# Patient Record
Sex: Female | Born: 1965 | Race: White | Hispanic: No | Marital: Married | Smoking: Never smoker
Health system: Southern US, Community
[De-identification: ages and names within clinical notes are randomized; demographics above are authoritative.]

## PROBLEM LIST (undated history)

## (undated) DIAGNOSIS — G43909 Migraine, unspecified, not intractable, without status migrainosus: Secondary | ICD-10-CM

## (undated) DIAGNOSIS — K209 Esophagitis, unspecified: Secondary | ICD-10-CM

## (undated) DIAGNOSIS — R55 Syncope and collapse: Secondary | ICD-10-CM

## (undated) HISTORY — DX: Migraine, unspecified, not intractable, without status migrainosus: G43.909

## (undated) HISTORY — DX: Esophagitis, unspecified: K20.9

## (undated) HISTORY — DX: Syncope and collapse: R55

---

## 1998-11-08 ENCOUNTER — Other Ambulatory Visit: Admission: RE | Admit: 1998-11-08 | Discharge: 1998-11-08 | Payer: Self-pay | Admitting: Obstetrics and Gynecology

## 1999-06-01 ENCOUNTER — Inpatient Hospital Stay (HOSPITAL_COMMUNITY): Admission: AD | Admit: 1999-06-01 | Discharge: 1999-06-03 | Payer: Self-pay | Admitting: *Deleted

## 2012-07-24 ENCOUNTER — Other Ambulatory Visit: Payer: Self-pay

## 2012-07-24 DIAGNOSIS — Z1231 Encounter for screening mammogram for malignant neoplasm of breast: Secondary | ICD-10-CM

## 2012-08-13 ENCOUNTER — Ambulatory Visit: Payer: Self-pay | Admitting: Certified Nurse Midwife

## 2012-08-21 ENCOUNTER — Ambulatory Visit
Admission: RE | Admit: 2012-08-21 | Discharge: 2012-08-21 | Disposition: A | Discharge status: Home / Self Care | Payer: 59 | Source: Ambulatory Visit

## 2012-08-21 ENCOUNTER — Ambulatory Visit: Payer: Self-pay

## 2012-08-21 DIAGNOSIS — Z1231 Encounter for screening mammogram for malignant neoplasm of breast: Secondary | ICD-10-CM

## 2012-08-24 ENCOUNTER — Encounter: Payer: Self-pay | Admitting: Certified Nurse Midwife

## 2012-08-26 ENCOUNTER — Encounter: Payer: Self-pay | Admitting: Certified Nurse Midwife

## 2012-08-26 ENCOUNTER — Ambulatory Visit (INDEPENDENT_AMBULATORY_CARE_PROVIDER_SITE_OTHER): Payer: 59 | Admitting: Certified Nurse Midwife

## 2012-08-26 VITALS — BP 136/82 | HR 64 | Resp 20 | Ht 65.0 in | Wt 214.8 lb

## 2012-08-26 DIAGNOSIS — Z0000 Encounter for general adult medical examination without abnormal findings: Secondary | ICD-10-CM

## 2012-08-26 DIAGNOSIS — Z01419 Encounter for gynecological examination (general) (routine) without abnormal findings: Secondary | ICD-10-CM

## 2012-08-26 LAB — COMPREHENSIVE METABOLIC PANEL
ALT: 19 U/L (ref 0–35)
AST: 19 U/L (ref 0–37)
Albumin: 4 g/dL (ref 3.5–5.2)
Alkaline Phosphatase: 86 U/L (ref 39–117)
BUN: 9 mg/dL (ref 6–23)
CO2: 27 mEq/L (ref 19–32)
Calcium: 9.4 mg/dL (ref 8.4–10.5)
Chloride: 103 mEq/L (ref 96–112)
Creat: 0.82 mg/dL (ref 0.50–1.10)
Glucose, Bld: 77 mg/dL (ref 70–99)
Potassium: 4.1 mEq/L (ref 3.5–5.3)
Sodium: 138 mEq/L (ref 135–145)
Total Bilirubin: 0.4 mg/dL (ref 0.3–1.2)
Total Protein: 6.8 g/dL (ref 6.0–8.3)

## 2012-08-26 LAB — HEMOGLOBIN, FINGERSTICK: Hemoglobin, fingerstick: 13.5 g/dL (ref 12.0–16.0)

## 2012-08-26 LAB — POCT URINALYSIS DIPSTICK
Bilirubin, UA: NEGATIVE
Blood, UA: NEGATIVE
Glucose, UA: NEGATIVE
Ketones, UA: NEGATIVE
Leukocytes, UA: NEGATIVE
Nitrite, UA: NEGATIVE
Protein, UA: NEGATIVE
Urobilinogen, UA: NEGATIVE
pH, UA: 5

## 2012-08-26 NOTE — Patient Instructions (Signed)

## 2012-08-26 NOTE — Progress Notes (Signed)
47 y.o. G56P2002 Married Caucasian F here for annual exam. Periods for the past two months normal for her, not heavy.  Prior to that scant to none in previous months. Denies hot flashes or night sweats.  No health issues today. Sees PCP prn. Patient's last menstrual period was 08/18/2012.          Sexually active: yes  The current method of family planning is vasectomy.    Exercising: yes  walking Smoker:  no  Health Maintenance: Pap:  08/13/11 WNL/negative HR HPV History of abnormal Pap:  no MMG:  08/21/12 3D-normal Colonoscopy:  none BMD:   none TDaP:  08/13/11 Screening Labs: , Hgb today: 13.5, Urine today: negative   reports that she has never smoked. She has never used smokeless tobacco. She reports that  drinks alcohol. She reports that she does not use illicit drugs.  Past Medical History  Diagnosis Date  . Migraines     no aura  . Syncopal episodes     situational    History reviewed. No pertinent past surgical history.  Current Outpatient Prescriptions  Medication Sig Dispense Refill  . ibuprofen (ADVIL,MOTRIN) 200 MG tablet Take 200 mg by mouth every 6 (six) hours as needed for pain.       No current facility-administered medications for this visit.    Family History  Problem Relation Age of Onset  . Hypertension Mother   . Depression Mother   . Hypertension Father   . Depression Father   . Depression Brother   . Hypertension Maternal Grandmother     ROS:  Pertinent items are noted in HPI.  Otherwise, a comprehensive ROS was negative.  Exam:   BP 136/82  Pulse 64  Resp 20  Ht 5\' 5"  (1.651 m)  Wt 214 lb 12.8 oz (97.433 kg)  BMI 35.74 kg/m2  LMP 08/18/2012  Weight change: @WEIGHTCHANGE @ Height:   Height: 5\' 5"  (165.1 cm)  Ht Readings from Last 3 Encounters:  08/26/12 5\' 5"  (1.651 m)    General appearance: alert, cooperative and appears stated age Head: Normocephalic, without obvious abnormality, atraumatic Neck: no adenopathy, supple, symmetrical,  trachea midline and thyroid normal to inspection and palpation Lungs: clear to auscultation bilaterally Breasts: normal appearance, no masses or tenderness, No nipple retraction or dimpling, No nipple discharge or bleeding, No axillary or supraclavicular adenopathy Heart: regular rate and rhythm Abdomen: soft, non-tender; bowel sounds normal; no masses,  no organomegaly Extremities: extremities normal, atraumatic, no cyanosis or edema Skin: Skin color, texture, turgor normal. No rashes or lesions Lymph nodes: Cervical, supraclavicular, and axillary nodes normal. No abnormal inguinal nodes palpated Neurologic: Grossly normal   Pelvic: External genitalia:  no lesions              Urethra:  normal appearing urethra with no masses, tenderness or lesions              Bartholins and Skenes: normal                 Vagina: normal appearing vagina with normal color and discharge, no lesions              Cervix: normal, non tender              Pap taken: no Bimanual Exam:  Uterus:  normal size, contour, position, consistency, mobility, non-tender and mid position              Adnexa: normal adnexa and no mass, fullness, tenderness  Rectovaginal: Confirms               Anus:  normal sphincter tone, no lesions  A:  Well Woman with normal exam  Perimenopausal with cycle changes    P:   Reviewed Health and wellness pertinent to exam  Discussed importance of notifying if no cycle for more than 3 months, has menses calendar. Pap smear per guidelines  Mammogram yearly pap smear not taken today Lab:CMP,TSH  counseled on breast self exam, adequate intake of calcium and vitamin D, diet and exercise  return annually or prn  An After Visit Summary was printed and given to the patient.   Reviewed, TL

## 2012-08-27 LAB — TSH: TSH: 2.534 u[IU]/mL (ref 0.350–4.500)

## 2012-08-28 ENCOUNTER — Telehealth: Payer: Self-pay | Admitting: *Deleted

## 2012-08-28 NOTE — Telephone Encounter (Signed)
Message copied by Osie Bond on Fri Aug 28, 2012  3:10 PM ------      Message from: Verner Chol      Created: Thu Aug 27, 2012  5:48 PM       Notify liver, kidney, glucose profile normal, Thyroid normal ------

## 2012-08-28 NOTE — Telephone Encounter (Signed)
Message copied by Osie Bond on Fri Aug 28, 2012  8:39 AM ------      Message from: Verner Chol      Created: Thu Aug 27, 2012  5:48 PM       Notify liver, kidney, glucose profile normal, Thyroid normal ------

## 2012-08-28 NOTE — Telephone Encounter (Signed)
Patient returning Donna Shepard's call.

## 2012-08-28 NOTE — Telephone Encounter (Signed)
Pt is aware of all normal lab results.

## 2012-08-28 NOTE — Telephone Encounter (Signed)
Pt is aware of all normal lab results.  

## 2012-08-28 NOTE — Telephone Encounter (Signed)
LVM to return my call in regards to lab results.  

## 2013-04-19 ENCOUNTER — Encounter: Payer: Self-pay | Admitting: Certified Nurse Midwife

## 2013-04-19 ENCOUNTER — Ambulatory Visit (INDEPENDENT_AMBULATORY_CARE_PROVIDER_SITE_OTHER): Payer: Managed Care, Other (non HMO) | Admitting: Certified Nurse Midwife

## 2013-04-19 VITALS — BP 126/84 | HR 84 | Temp 97.5°F | Resp 16 | Ht 65.0 in | Wt 221.0 lb

## 2013-04-19 DIAGNOSIS — N390 Urinary tract infection, site not specified: Secondary | ICD-10-CM

## 2013-04-19 LAB — POCT URINALYSIS DIPSTICK
Bilirubin, UA: NEGATIVE
Glucose, UA: NEGATIVE
Ketones, UA: NEGATIVE
Nitrite, UA: NEGATIVE
Protein, UA: NEGATIVE
Urobilinogen, UA: NEGATIVE
pH, UA: 5

## 2013-04-19 MED ORDER — NITROFURANTOIN MONOHYD MACRO 100 MG PO CAPS: 100.0000 mg | ORAL_CAPSULE | Freq: Two times a day (BID) | ORAL | Status: DC

## 2013-04-19 NOTE — Patient Instructions (Signed)
Urinary Tract Infection  Urinary tract infections (UTIs) can develop anywhere along your urinary tract. Your urinary tract is your body's drainage system for removing wastes and extra water. Your urinary tract includes two kidneys, two ureters, a bladder, and a urethra. Your kidneys are a pair of bean-shaped organs. Each kidney is about the size of your fist. They are located below your ribs, one on each side of your spine.  CAUSES  Infections are caused by microbes, which are microscopic organisms, including fungi, viruses, and bacteria. These organisms are so small that they can only be seen through a microscope. Bacteria are the microbes that most commonly cause UTIs.  SYMPTOMS   Symptoms of UTIs may vary by age and gender of the patient and by the location of the infection. Symptoms in young women typically include a frequent and intense urge to urinate and a painful, burning feeling in the bladder or urethra during urination. Older women and men are more likely to be tired, shaky, and weak and have muscle aches and abdominal pain. A fever may mean the infection is in your kidneys. Other symptoms of a kidney infection include pain in your back or sides below the ribs, nausea, and vomiting.  DIAGNOSIS  To diagnose a UTI, your caregiver will ask you about your symptoms. Your caregiver also will ask to provide a urine sample. The urine sample will be tested for bacteria and white blood cells. White blood cells are made by your body to help fight infection.  TREATMENT   Typically, UTIs can be treated with medication. Because most UTIs are caused by a bacterial infection, they usually can be treated with the use of antibiotics. The choice of antibiotic and length of treatment depend on your symptoms and the type of bacteria causing your infection.  HOME CARE INSTRUCTIONS   If you were prescribed antibiotics, take them exactly as your caregiver instructs you. Finish the medication even if you feel better after you  have only taken some of the medication.   Drink enough water and fluids to keep your urine clear or pale yellow.   Avoid caffeine, tea, and carbonated beverages. They tend to irritate your bladder.   Empty your bladder often. Avoid holding urine for long periods of time.   Empty your bladder before and after sexual intercourse.   After a bowel movement, women should cleanse from front to back. Use each tissue only once.  SEEK MEDICAL CARE IF:    You have back pain.   You develop a fever.   Your symptoms do not begin to resolve within 3 days.  SEEK IMMEDIATE MEDICAL CARE IF:    You have severe back pain or lower abdominal pain.   You develop chills.   You have nausea or vomiting.   You have continued burning or discomfort with urination.  MAKE SURE YOU:    Understand these instructions.   Will watch your condition.   Will get help right away if you are not doing well or get worse.  Document Released: 11/07/2004 Document Revised: 07/30/2011 Document Reviewed: 03/08/2011  [employer] Patient Information 2014 [employer].

## 2013-04-19 NOTE — Progress Notes (Signed)
S:  47 y.o.Married Caucasian female presents with complaint of UTI  symptoms of blood in urine, dysuria, urinary frequency, urinary urgency for the past 2 days. Denies fever or chills, headache or backache. Patient has also had a cold she is recovering from..   Symptoms not related to post coital. Current method of birth control vasectomy. Periods still light, but has not missed any periods since last evaluation for amenorrhea.   O:alert, oriented to person, place, and time, affect appropriate to mood, Healthy WDWN female Skin:Warm and dry Lungs: clear, no rales CVAT bilateral negative Abdomen: positive for suprapubic pain Pelvic exam: External genital: normal appearance, no lesions Bladder, urethra tender, urethral meatus slightly tender and pink. Vagina: normal, scant discharge Cervix: non tender normal Uterus: normal, non tender Adnexa: normal, non tender, no masses     Diagnostic Test:    Urinalysis wbc-2+, rbc 2+   Assessment:UTI Recovering from cold.  P:Reviewed findings of UTI and need to continue to increase water intake and need for Rx.  Medications: Rx Macrobid see order. Maintain adequate hydration. Follow up if symptoms not improving, and as needed. [facility] micro/culture     RV prn

## 2013-04-19 NOTE — Progress Notes (Signed)
Reviewed personally.  M. Suzanne Adiah Guereca, MD.  

## 2013-04-20 LAB — URINALYSIS, MICROSCOPIC ONLY
Bacteria, UA: NONE SEEN
Casts: NONE SEEN
Crystals: NONE SEEN
Squamous Epithelial / LPF: NONE SEEN

## 2013-04-20 LAB — URINE CULTURE: Colony Count: 8000

## 2013-04-21 ENCOUNTER — Other Ambulatory Visit: Payer: Self-pay | Admitting: Certified Nurse Midwife

## 2013-04-21 DIAGNOSIS — N390 Urinary tract infection, site not specified: Secondary | ICD-10-CM

## 2013-05-06 ENCOUNTER — Ambulatory Visit (INDEPENDENT_AMBULATORY_CARE_PROVIDER_SITE_OTHER): Payer: Managed Care, Other (non HMO) | Admitting: *Deleted

## 2013-05-06 VITALS — BP 102/70 | HR 64 | Resp 18 | Ht 65.0 in | Wt 218.0 lb

## 2013-05-06 DIAGNOSIS — N390 Urinary tract infection, site not specified: Secondary | ICD-10-CM

## 2013-05-06 NOTE — Progress Notes (Signed)
Patient in today to repeat Urine Micro. Patient states she has no sx or concerns. Pt states she finished abx about 1 week ago.  Advise pt we will call with results. She can call us if she has any concerns - Pt agreed.

## 2013-05-07 LAB — URINALYSIS, MICROSCOPIC ONLY
Bacteria, UA: NONE SEEN
Casts: NONE SEEN
Crystals: NONE SEEN
Squamous Epithelial / LPF: NONE SEEN

## 2013-08-27 ENCOUNTER — Encounter: Payer: Self-pay | Admitting: Certified Nurse Midwife

## 2013-08-27 ENCOUNTER — Ambulatory Visit (INDEPENDENT_AMBULATORY_CARE_PROVIDER_SITE_OTHER): Payer: Managed Care, Other (non HMO) | Admitting: Certified Nurse Midwife

## 2013-08-27 VITALS — BP 110/70 | HR 68 | Resp 16 | Ht 65.25 in | Wt 218.0 lb

## 2013-08-27 DIAGNOSIS — Z01419 Encounter for gynecological examination (general) (routine) without abnormal findings: Secondary | ICD-10-CM

## 2013-08-27 DIAGNOSIS — Z124 Encounter for screening for malignant neoplasm of cervix: Secondary | ICD-10-CM

## 2013-08-27 NOTE — Progress Notes (Signed)
Reviewed personally.  M. Suzanne Byran Bilotti, MD.  

## 2013-08-27 NOTE — Patient Instructions (Signed)

## 2013-08-27 NOTE — Progress Notes (Signed)
48 y.o. G17P2002 Married Caucasian Fe here for annual exam.  Periods still regular,changing in amount to lighter and mild cramping. Sees PCP prn. No health issues today. Desires no labs today.  Patient's last menstrual period was 08/07/2013.          Sexually active: Yes.    The current method of family planning is vasectomy.    Exercising: Yes.    walk & bike Smoker:  no  Health Maintenance: Pap:  08-13-11 neg HPV HR neg MMG: 08-21-12 density category b, birads category 1: negative Colonoscopy: none BMD:   none TDaP:  08-13-11 Labs:  none Self breast exam: done occ   reports that she has never smoked. She has never used smokeless tobacco. She reports that she does not drink alcohol or use illicit drugs.  Past Medical History  Diagnosis Date  . Migraines     no aura  . Syncopal episodes     situational    History reviewed. No pertinent past surgical history.  No current outpatient prescriptions on file.   No current facility-administered medications for this visit.    Family History  Problem Relation Age of Onset  . Hypertension Mother   . Depression Mother   . Hypertension Father   . Depression Father   . Depression Brother   . Hypertension Maternal Grandmother     ROS:  Pertinent items are noted in HPI.  Otherwise, a comprehensive ROS was negative.  Exam:   BP 110/70  Pulse 68  Resp 16  Ht 5' 5.25" (1.657 m)  Wt 218 lb (98.884 kg)  BMI 36.01 kg/m2  LMP 08/07/2013 Height: 5' 5.25" (165.7 cm)  Ht Readings from Last 3 Encounters:  08/27/13 5' 5.25" (1.657 m)  05/06/13 5\' 5"  (1.651 m)  04/19/13 5\' 5"  (1.651 m)    General appearance: alert, cooperative and appears stated age Head: Normocephalic, without obvious abnormality, atraumatic Neck: no adenopathy, supple, symmetrical, trachea midline and thyroid normal to inspection and palpation and non-palpable Lungs: clear to auscultation bilaterally Breasts: normal appearance, no masses or tenderness, No nipple  retraction or dimpling, No nipple discharge or bleeding, No axillary or supraclavicular adenopathy Heart: regular rate and rhythm Abdomen: soft, non-tender; no masses,  no organomegaly Extremities: extremities normal, atraumatic, no cyanosis or edema Skin: Skin color, texture, turgor normal. No rashes or lesions Lymph nodes: Cervical, supraclavicular, and axillary nodes normal. No abnormal inguinal nodes palpated Neurologic: Grossly normal   Pelvic: External genitalia:  no lesions              Urethra:  normal appearing urethra with no masses, tenderness or lesions              Bartholin's and Skene's: normal                 Vagina: normal appearing vagina with normal color and discharge, no lesions              Cervix: normal, non tender, bleeding with pap only              Pap taken: Yes.   Bimanual Exam:  Uterus:  normal size, contour, position, consistency, mobility, non-tender and anteverted              Adnexa: normal adnexa and no mass, fullness, tenderness               Rectovaginal: Confirms               Anus:  normal sphincter tone, no lesions  A:  Well Woman with normal exam  Perimenopausal with some cycle change, no amenorrhea episodes keeping menses calendar    P:   Reviewed health and wellness pertinent to exam  Pap smear taken today with HPV reflex  Will advise if menses changes or period of amenorrhea of 3  Months.  counseled on breast self exam, mammography screening, adequate intake of calcium and vitamin D, diet and exercise  return annually or prn  An After Visit Summary was printed and given to the patient.

## 2013-09-01 LAB — IPS PAP TEST WITH REFLEX TO HPV

## 2013-12-13 ENCOUNTER — Encounter: Payer: Self-pay | Admitting: Certified Nurse Midwife

## 2013-12-21 ENCOUNTER — Telehealth: Payer: Self-pay | Admitting: Certified Nurse Midwife

## 2013-12-21 NOTE — Telephone Encounter (Signed)
Patient calling to report she has not had a menstrual cycle in "over three months." She requests to speak with nurse about this.

## 2013-12-21 NOTE — Telephone Encounter (Signed)
Patient will need OV for assessment and labs drawn at that time.

## 2013-12-21 NOTE — Telephone Encounter (Signed)
Patient was seen on 08/27/13 for aex. Patient is perimenopausal and was told to call if no menses for 3 months. Patient is calling stating that she has not had a cycle in over three months and would like to know what to do from here. OV notes seen below.  A: Well Woman with normal exam Perimenopausal with some cycle change, no amenorrhea episodes keeping menses calendar   P: Reviewed health and wellness pertinent to exam Pap smear taken today with HPV reflex Will advise if menses changes or period of amenorrhea of 3 Months.  Regina Eck CNM please advise.

## 2013-12-22 NOTE — Telephone Encounter (Signed)
Spoke with patient. Advised patient of message as seen below from Regina Eck CNM. Patient is agreeable and verbalizes understanding. Appointment scheduled for 11/17 at 12:45pm with Regina Eck CNM. Patient is agreeable to date and time.  Routing to provider for final review. Patient agreeable to disposition. Will close encounter

## 2013-12-22 NOTE — Telephone Encounter (Signed)
Left message to call Kaitlyn at [phone]. 

## 2013-12-28 ENCOUNTER — Encounter: Payer: Self-pay | Admitting: Certified Nurse Midwife

## 2013-12-28 ENCOUNTER — Ambulatory Visit: Payer: Managed Care, Other (non HMO) | Admitting: Certified Nurse Midwife

## 2013-12-28 ENCOUNTER — Ambulatory Visit (INDEPENDENT_AMBULATORY_CARE_PROVIDER_SITE_OTHER): Payer: Managed Care, Other (non HMO) | Admitting: Certified Nurse Midwife

## 2013-12-28 VITALS — BP 108/62 | HR 70 | Resp 16 | Ht 65.25 in | Wt 222.0 lb

## 2013-12-28 DIAGNOSIS — N912 Amenorrhea, unspecified: Secondary | ICD-10-CM

## 2013-12-28 DIAGNOSIS — N951 Menopausal and female climacteric states: Secondary | ICD-10-CM

## 2013-12-28 LAB — POCT URINE PREGNANCY: Preg Test, Ur: NEGATIVE

## 2013-12-28 MED ORDER — MEDROXYPROGESTERONE ACETATE 10 MG PO TABS: 10.0000 mg | ORAL_TABLET | Freq: Every day | ORAL | Status: DC

## 2013-12-28 NOTE — Patient Instructions (Addendum)

## 2013-12-28 NOTE — Progress Notes (Signed)
  48 y.o.MarriedCaucasianfemale presents with no menses since 08/10/13 .  Currently periods were monthly and normal occurring every 28-30 days. Patient having occasional hot flashes, no night sweats. Denies vaginal dryness, weight gain or other health issues.Contraception is spouse vasectomy. No other health issues. Last aex 08/27/13.  O: healthy WDWN female Orientation x 3  Exam: Thyroid: no enlargement or nodules Abdomen: soft no masses or tenderness Pelvic exam: Normal female genitalia, BUS negative Vagina: normal, non tender Cervix: normal, no lesions or tenderness Uterus: normal, non tender Adnexa: normal, no masses or tenderness  Assessment:  Amenorrhea negative UPT with perimenopausal symptoms Normal Pelvic exam  Plan: Reviewed normal exam finding.  Discussed with patient factors that may be contributory to menstrual abnormalities include diet weight change and hormonal factors Peri-Menopausal and thyroid or Pituitary changes.Questions addressed. Discussed Provera challenge use which was used a year ago for prolonged amenorrhea with withdrawal bleeding. Patient aware and agreeable to use. Rx Provera see order with instructions Lab:TSH,FSH,Prolactin Given menses calendar to keep and instructions to advise if has or does not have withdrawal bleeding after use.  Rv prn

## 2013-12-29 LAB — PROLACTIN: Prolactin: 22.4 ng/mL

## 2013-12-29 LAB — TSH: TSH: 3.28 u[IU]/mL (ref 0.350–4.500)

## 2013-12-29 LAB — FOLLICLE STIMULATING HORMONE: FSH: 6.2 m[IU]/mL

## 2013-12-31 NOTE — Progress Notes (Signed)
Reviewed personally.  M. Suzanne Zaydee Aina, MD.  

## 2014-01-26 ENCOUNTER — Telehealth: Payer: Self-pay | Admitting: Certified Nurse Midwife

## 2014-01-26 NOTE — Telephone Encounter (Signed)
Patient calling to report she took Provera and her cycle started 01/07/14. FYI only.

## 2014-01-26 NOTE — Telephone Encounter (Signed)
Patient seen 12/28/13. [place] showed patient not in menopause. Took Provera challenge. Had withdrawal bleed which began 01/07/14. Patient performed Provera challenge last year as well with withdrawal bleed.

## 2014-01-26 NOTE — Telephone Encounter (Signed)
Spoke with patient. Advised patient of message as seen below from Deborah S. Leonard CNM. Patient is agreeable and verbalizes understanding.   Routing to provider for final review. Patient agreeable to disposition. Will close encounter   

## 2014-01-26 NOTE — Telephone Encounter (Signed)
Patient  Needs to call if not period in next 3 months

## 2014-04-20 ENCOUNTER — Ambulatory Visit (INDEPENDENT_AMBULATORY_CARE_PROVIDER_SITE_OTHER): Payer: Managed Care, Other (non HMO) | Admitting: Nurse Practitioner

## 2014-04-20 ENCOUNTER — Telehealth: Payer: Self-pay | Admitting: Certified Nurse Midwife

## 2014-04-20 ENCOUNTER — Encounter: Payer: Self-pay | Admitting: Nurse Practitioner

## 2014-04-20 VITALS — BP 110/76 | HR 72 | Temp 97.9°F | Resp 16 | Ht 65.25 in | Wt 224.0 lb

## 2014-04-20 DIAGNOSIS — N390 Urinary tract infection, site not specified: Secondary | ICD-10-CM | POA: Diagnosis not present

## 2014-04-20 DIAGNOSIS — R319 Hematuria, unspecified: Secondary | ICD-10-CM

## 2014-04-20 LAB — POCT URINALYSIS DIPSTICK
Bilirubin, UA: NEGATIVE
Blood, UA: 50
Glucose, UA: NEGATIVE
Ketones, UA: NEGATIVE
Leukocytes, UA: NEGATIVE
Nitrite, UA: NEGATIVE
Protein, UA: NEGATIVE
Urobilinogen, UA: NEGATIVE
pH, UA: 5

## 2014-04-20 MED ORDER — NITROFURANTOIN MONOHYD MACRO 100 MG PO CAPS: 100.0000 mg | ORAL_CAPSULE | Freq: Two times a day (BID) | ORAL | Status: DC

## 2014-04-20 NOTE — Telephone Encounter (Signed)
Spoke with patient. Patient states that she began to notice blood after urination with wiping last week. "I thought it would go away but I am still noticing it after I go to the bathroom. Now I am having increased frequency." Denies back pain, fever, or chills. Advised will need to be seen in office for evaluation. Patient is agreeable. Appointment scheduled for today at 11:30am with Milford Cage, [place]. Patient is agreeable to date and time.  Routing to provider for final review. Patient agreeable to disposition. Will close encounter

## 2014-04-20 NOTE — Patient Instructions (Signed)

## 2014-04-20 NOTE — Progress Notes (Signed)
S:  49 y.o.MW Fe presents with complaint of UTI. Symptoms began 4 days ago.  She had lower pelvic pressure and put on a light day panty liner, noted pink staining on the front of the pad along with symptoms of blood in urine, urinary frequency.  The patient is having no constitutional symptoms, denying fever, chills, anorexia, or weight loss.. Sexually active yes  Symptoms are not related to post coital. Current method of birth control is vasectomy.  Peri Menopausal with recent history of irregular menses.  PMP with Provera was 01/07/14.  Then menses on her own 03/30/14 for a week that was light.  Vaginal dryness: yes.   Same partner without change. Last UTI documented a year ago.   ROS: no weight loss, fever, night sweats, feels well and generally weak  O alert, oriented to person, place, and time   obese, healthy,  alert and  not in acute distress  mild  No CVA tenderness  cervix normal in appearance, external genitalia normal, no adnexal masses or tenderness, no cervical motion tenderness and light bleeding from the cervix.   Diagnostic Test:    Urinalysis: normal   Urine culture and micro   Assessment: R/O UTI   Urethritis    Perimenopausal with irregular menses - continue to monitor   Plan:  Maintain adequate hydration. Follow up if symptoms not improving, and as needed.   Medication Therapy:  Macrobid 100 mg BID for a week   Lab: follow with urine culture  RV

## 2014-04-20 NOTE — Telephone Encounter (Signed)
Patient calling requesting to be seen for a urinary tract infection. She says she has blood in her urine.

## 2014-04-21 LAB — URINE CULTURE
Colony Count: NO GROWTH
Organism ID, Bacteria: NO GROWTH

## 2014-04-21 LAB — URINALYSIS, MICROSCOPIC ONLY
Bacteria, UA: NONE SEEN
Casts: NONE SEEN
Crystals: NONE SEEN
Squamous Epithelial / LPF: NONE SEEN

## 2014-04-25 ENCOUNTER — Telehealth: Payer: Self-pay | Admitting: *Deleted

## 2014-04-25 NOTE — Telephone Encounter (Signed)
I have attempted to contact this patient by phone with the following results: left message to return call to [place] at [phone] on answering machine (mobile per [facility]).  Pt name verified on voicemail-advised call regarding recent labs.  [phone] (Mobile) *Preferred*

## 2014-04-25 NOTE — Progress Notes (Signed)
Encounter reviewed by Dr. Darrill Vreeland Silva.  

## 2014-04-25 NOTE — Telephone Encounter (Signed)
-----   Message from Regina Eck, CNM sent at 04/22/2014 12:51 PM EST ----- Notify patient that urine micro was negative,no blood noted Urine culture negative  Needs to complete medication due to urethritis noted

## 2014-04-26 NOTE — Telephone Encounter (Signed)
Pt notified in result note.  Closing encounter. 

## 2014-09-16 ENCOUNTER — Encounter: Payer: Self-pay | Admitting: Certified Nurse Midwife

## 2014-09-16 ENCOUNTER — Ambulatory Visit (INDEPENDENT_AMBULATORY_CARE_PROVIDER_SITE_OTHER): Payer: Managed Care, Other (non HMO) | Admitting: Certified Nurse Midwife

## 2014-09-16 VITALS — BP 136/80 | HR 64 | Ht 65.0 in | Wt 226.0 lb

## 2014-09-16 DIAGNOSIS — Z01419 Encounter for gynecological examination (general) (routine) without abnormal findings: Secondary | ICD-10-CM

## 2014-09-16 DIAGNOSIS — Z0000 Encounter for general adult medical examination without abnormal findings: Secondary | ICD-10-CM | POA: Diagnosis not present

## 2014-09-16 DIAGNOSIS — N951 Menopausal and female climacteric states: Secondary | ICD-10-CM | POA: Diagnosis not present

## 2014-09-16 LAB — POCT URINALYSIS DIPSTICK
Urobilinogen, UA: NEGATIVE
pH, UA: 5

## 2014-09-16 LAB — HEMOGLOBIN, FINGERSTICK: Hemoglobin, fingerstick: 12.1 g/dL (ref 12.0–16.0)

## 2014-09-16 NOTE — Addendum Note (Signed)
Addended by: Regina Eck on: 09/16/2014 04:40 PM   Modules accepted: Miquel Dunn

## 2014-09-16 NOTE — Progress Notes (Addendum)
49 y.o. G72P2002 Married  Caucasian Fe here for annual exam. Amenorrhea since 03/30/14. Denies hot flashes or night sweats. Denies vaginal dryness or spotting. Sees urgent care.  Slight stress incontinence only with coughing, no spontaneous leaking. Denies any urinary frequency, urgency or pain with urination. No other health issues today.  Patient's last menstrual period was 03/30/2014 (approximate).          Sexually active: Yes.    The current method of family planning is none and Husband has Vasectomy.   Exercising: Yes.    swim, walk 3-5x/wk Smoker:  no  Health Maintenance: Pap:  08/27/13 wnl MMG:  08/25/2012 breast density category b; bi-rads 1: negative Colonoscopy:  none BMD:   none TDaP:  08/13/2011 Labs: Hgb:  12.1  ; Urine: Trace wbc's   reports that she has never smoked. She has never used smokeless tobacco. She reports that she does not drink alcohol or use illicit drugs.  Past Medical History  Diagnosis Date  . Migraines     no aura  . Syncopal episodes     situational    No past surgical history on file.  Current Outpatient Prescriptions  Medication Sig Dispense Refill  . Multiple Vitamins-Minerals (MULTIVITAMIN PO) Take by mouth daily.     No current facility-administered medications for this visit.    Family History  Problem Relation Age of Onset  . Hypertension Mother   . Depression Mother   . Hypertension Father   . Depression Father   . Depression Brother   . Hypertension Maternal Grandmother     ROS:  Pertinent items are noted in HPI.  Otherwise, a comprehensive ROS was negative.  Exam:   BP 136/80 mmHg  Pulse 64  Ht 5\' 5"  (1.651 m)  Wt 226 lb (102.513 kg)  BMI 37.61 kg/m2  LMP 03/30/2014 (Approximate) Height: 5\' 5"  (165.1 cm) Ht Readings from Last 3 Encounters:  09/16/14 5\' 5"  (1.651 m)  04/20/14 5' 5.25" (1.657 m)  12/28/13 5' 5.25" (1.657 m)    General appearance: alert, cooperative and appears stated age Head: Normocephalic, without  obvious abnormality, atraumatic Neck: no adenopathy, supple, symmetrical, trachea midline and thyroid normal to inspection and palpation Lungs: clear to auscultation bilaterally Breasts: normal appearance, no masses or tenderness, No nipple retraction or dimpling, No nipple discharge or bleeding, No axillary or supraclavicular adenopathy Heart: regular rate and rhythm Abdomen: soft, non-tender; no masses,  no organomegaly Extremities: extremities normal, atraumatic, no cyanosis or edema Skin: Skin color, texture, turgor normal. No rashes or lesions Lymph nodes: Cervical, supraclavicular, and axillary nodes normal. No abnormal inguinal nodes palpated Neurologic: Grossly normal   Pelvic: External genitalia:  no lesions              Urethra:  normal appearing urethra with no masses, tenderness or lesions              Bartholin's and Skene's: normal                 Vagina: normal appearing vagina with normal color and discharge, no lesions              Cervix: normal,non tender, no lesions              Pap taken: No. Bimanual Exam:  Uterus:  normal size, contour, position, consistency, mobility, non-tender and mid position              Adnexa: normal adnexa and no mass, fullness, tenderness  Rectovaginal: Confirms               Anus:  normal sphincter tone, no lesions  Chaperone present: Yes  A:  Well Woman with normal exam  Perimenopausal with amenorrhea  Screening labs   P:   Reviewed health and wellness pertinent to exam  Discussed may need Provera challenge again, but will await labs and will advise. Continue to keep menses calendar. Consider [place]ronor if not menopausal for amenorrhea control. Will consider.  Labs TSH,Prolactin, FSH, Estradiol  Lab: Lipid panel, CMP, Vitamin D,   Pap smear as above   counseled on breast self exam, mammography screening, menopause, adequate intake of calcium and vitamin D, diet and exercise  return annually or prn  An After Visit  Summary was printed and given to the patient.

## 2014-09-16 NOTE — Patient Instructions (Signed)
EXERCISE AND DIET:  We recommended that you start or continue a regular exercise program for good health. Regular exercise means any activity that makes your heart beat faster and makes you sweat.  We recommend exercising at least 30 minutes per day at least 3 days a week, preferably 4 or 5.  We also recommend a diet low in fat and sugar.  Inactivity, poor dietary choices and obesity can cause diabetes, heart attack, stroke, and kidney damage, among others.    ALCOHOL AND SMOKING:  Women should limit their alcohol intake to no more than 7 drinks/beers/glasses of wine (combined, not each!) per week. Moderation of alcohol intake to this level decreases your risk of breast cancer and liver damage. And of course, no recreational drugs are part of a healthy lifestyle.  And absolutely no smoking or even second hand smoke. Most people know smoking can cause heart and lung diseases, but did you know it also contributes to weakening of your bones? Aging of your skin?  Yellowing of your teeth and nails?  CALCIUM AND VITAMIN D:  Adequate intake of calcium and Vitamin D are recommended.  The recommendations for exact amounts of these supplements seem to change often, but generally speaking 600 mg of calcium (either carbonate or citrate) and 800 units of Vitamin D per day seems prudent. Certain women may benefit from higher intake of Vitamin D.  If you are among these women, your doctor will have told you during your visit.    PAP SMEARS:  Pap smears, to check for cervical cancer or precancers,  have traditionally been done yearly, although recent scientific advances have shown that most women can have pap smears less often.  However, every woman still should have a physical exam from her gynecologist every year. It will include a breast check, inspection of the vulva and vagina to check for abnormal growths or skin changes, a visual exam of the cervix, and then an exam to evaluate the size and shape of the uterus and  ovaries.  And after 50 years of age, a rectal exam is indicated to check for rectal cancers. We will also provide age appropriate advice regarding health maintenance, like when you should have certain vaccines, screening for sexually transmitted diseases, bone density testing, colonoscopy, mammograms, etc.   MAMMOGRAMS:  All women over 40 years old should have a yearly mammogram. Many facilities now offer a "3D" mammogram, which may cost around $50 extra out of pocket. If possible,  we recommend you accept the option to have the 3D mammogram performed.  It both reduces the number of women who will be called back for extra views which then turn out to be normal, and it is better than the routine mammogram at detecting truly abnormal areas.    COLONOSCOPY:  Colonoscopy to screen for colon cancer is recommended for all women at age 50.  We know, you hate the idea of the prep.  We agree, BUT, having colon cancer and not knowing it is worse!!  Colon cancer so often starts as a polyp that can be seen and removed at colonscopy, which can quite literally save your life!  And if your first colonoscopy is normal and you have no family history of colon cancer, most women don't have to have it again for 10 years.  Once every ten years, you can do something that may end up saving your life, right?  We will be happy to help you get it scheduled when you are ready.    Be sure to check your insurance coverage so you understand how much it will cost.  It may be covered as a preventative service at no cost, but you should check your particular policy.     Perimenopause Perimenopause is the time when your body begins to move into the menopause (no menstrual period for 12 straight months). It is a natural process. Perimenopause can begin 2-8 years before the menopause and usually lasts for 1 year after the menopause. During this time, your ovaries may or may not produce an egg. The ovaries vary in their production of estrogen and  progesterone hormones each month. This can cause irregular menstrual periods, difficulty getting pregnant, vaginal bleeding between periods, and uncomfortable symptoms. CAUSES  Irregular production of the ovarian hormones, estrogen and progesterone, and not ovulating every month.  Other causes include:  Tumor of the pituitary gland in the brain.  Medical disease that affects the ovaries.  Radiation treatment.  Chemotherapy.  Unknown causes.  Heavy smoking and excessive alcohol intake can bring on perimenopause sooner. SIGNS AND SYMPTOMS   Hot flashes.  Night sweats.  Irregular menstrual periods.  Decreased sex drive.  Vaginal dryness.  Headaches.  Mood swings.  Depression.  Memory problems.  Irritability.  Tiredness.  Weight gain.  Trouble getting pregnant.  The beginning of losing bone cells (osteoporosis).  The beginning of hardening of the arteries (atherosclerosis). DIAGNOSIS  Your health care provider will make a diagnosis by analyzing your age, menstrual history, and symptoms. He or she will do a physical exam and note any changes in your body, especially your female organs. Female hormone tests may or may not be helpful depending on the amount of female hormones you produce and when you produce them. However, other hormone tests may be helpful to rule out other problems. TREATMENT  In some cases, no treatment is needed. The decision on whether treatment is necessary during the perimenopause should be made by you and your health care provider based on how the symptoms are affecting you and your lifestyle. Various treatments are available, such as:  Treating individual symptoms with a specific medicine for that symptom.  Herbal medicines that can help specific symptoms.  Counseling.  Group therapy. HOME CARE INSTRUCTIONS   Keep track of your menstrual periods (when they occur, how heavy they are, how long between periods, and how long they last) as  well as your symptoms and when they started.  Only take over-the-counter or prescription medicines as directed by your health care provider.  Sleep and rest.  Exercise.  Eat a diet that contains calcium (good for your bones) and soy (acts like the estrogen hormone).  Do not smoke.  Avoid alcoholic beverages.  Take vitamin supplements as recommended by your health care provider. Taking vitamin E may help in certain cases.  Take calcium and vitamin D supplements to help prevent bone loss.  Group therapy is sometimes helpful.  Acupuncture may help in some cases. SEEK MEDICAL CARE IF:   You have questions about any symptoms you are having.  You need a referral to a specialist (gynecologist, psychiatrist, or psychologist). SEEK IMMEDIATE MEDICAL CARE IF:   You have vaginal bleeding.  Your period lasts longer than 8 days.  Your periods are recurring sooner than 21 days.  You have bleeding after intercourse.  You have severe depression.  You have pain when you urinate.  You have severe headaches.  You have vision problems. Document Released: 03/07/2004 Document Revised: 11/18/2012 Document Reviewed: 08/27/2012 ExitCare  Patient Information 2015 [employer]. This information is not intended to replace advice given to you by your health care provider. Make sure you discuss any questions you have with your health care provider.  

## 2014-09-16 NOTE — Progress Notes (Signed)
Reviewed personally.  M. Suzanne Lameshia Hypolite, MD.  

## 2014-09-17 LAB — COMPREHENSIVE METABOLIC PANEL
ALT: 13 U/L (ref 6–29)
AST: 15 U/L (ref 10–35)
Albumin: 3.7 g/dL (ref 3.6–5.1)
Alkaline Phosphatase: 76 U/L (ref 33–130)
BUN: 15 mg/dL (ref 7–25)
CO2: 28 mmol/L (ref 20–31)
Calcium: 9.6 mg/dL (ref 8.6–10.4)
Chloride: 103 mmol/L (ref 98–110)
Creat: 0.89 mg/dL (ref 0.50–1.05)
Glucose, Bld: 97 mg/dL (ref 65–99)
Potassium: 3.9 mmol/L (ref 3.5–5.3)
Sodium: 137 mmol/L (ref 135–146)
Total Bilirubin: 0.3 mg/dL (ref 0.2–1.2)
Total Protein: 6.4 g/dL (ref 6.1–8.1)

## 2014-09-17 LAB — LIPID PANEL
Cholesterol: 148 mg/dL (ref 125–200)
HDL: 59 mg/dL (ref >=46)
LDL Cholesterol: 63 mg/dL (ref <130)
Total CHOL/HDL Ratio: 2.5 Ratio (ref <=5.0)
Triglycerides: 131 mg/dL (ref <150)
VLDL: 26 mg/dL (ref <30)

## 2014-09-17 LAB — ESTRADIOL: Estradiol: 669 pg/mL

## 2014-09-17 LAB — PROLACTIN: Prolactin: 22.8 ng/mL

## 2014-09-17 LAB — VITAMIN D 25 HYDROXY (VIT D DEFICIENCY, FRACTURES): Vit D, 25-Hydroxy: 16 ng/mL — ABNORMAL LOW (ref 30–100)

## 2014-09-17 LAB — FOLLICLE STIMULATING HORMONE: FSH: 5.6 m[IU]/mL

## 2014-09-17 LAB — TSH: TSH: 3.485 u[IU]/mL (ref 0.350–4.500)

## 2014-09-20 ENCOUNTER — Other Ambulatory Visit: Payer: Self-pay | Admitting: Certified Nurse Midwife

## 2014-09-20 DIAGNOSIS — R899 Unspecified abnormal finding in specimens from other organs, systems and tissues: Secondary | ICD-10-CM

## 2014-09-21 ENCOUNTER — Telehealth: Payer: Self-pay | Admitting: Emergency Medicine

## 2014-09-21 ENCOUNTER — Other Ambulatory Visit: Payer: Self-pay | Admitting: Certified Nurse Midwife

## 2014-09-21 DIAGNOSIS — R7989 Other specified abnormal findings of blood chemistry: Secondary | ICD-10-CM

## 2014-09-21 DIAGNOSIS — E559 Vitamin D deficiency, unspecified: Secondary | ICD-10-CM

## 2014-09-21 MED ORDER — MEDROXYPROGESTERONE ACETATE 10 MG PO TABS: 10.0000 mg | ORAL_TABLET | Freq: Every day | ORAL | Status: DC

## 2014-09-21 MED ORDER — VITAMIN D (ERGOCALCIFEROL) 1.25 MG (50000 UNIT) PO CAPS: 50000.0000 [IU] | ORAL_CAPSULE | ORAL | Status: DC

## 2014-09-21 NOTE — Telephone Encounter (Signed)
-----   Message from Regina Eck, CNM sent at 09/20/2014  4:37 PM EDT ----- Notify patient that vitamin is low and will need protocol  Prolactin high normal range FSH not showing menopause with low range, Due to this she will need Provera challenge. Rx Provera in, needs to take for 10 days and call if no bleeding or bleeding with use. She may expect bleeding up to 2 weeks after completion of medication. This could be heavy due to no menses since 2/16. Estradiol is very high and recommendation is to do PUS to evaluate uterus and ovaries . Discussed with Dr. Sabra Heck and Dr. Talbert Nan and agree this should be done. Order in, Please schedule. Let patient know she will be contacted with insurance infor . TSH normal Lipid panel normal Liver, kidney, glucose profile is normal

## 2014-09-21 NOTE — Telephone Encounter (Signed)
Patient returned call. Message from Regina Eck CNM given. Order sent for rx Vitamin D. Patient will start with one tablet weekly for 8 weeks, then, over the counter vitmain D3 800 international units daily for 8 weeks and return to lab appointment scheduled for vitamin D check in November, scheduled.   Provera 10 mg po daily for ten days then call back with update in two weeks, order sent to [facility], instructions given. Patient verbalized understanding.   Message regarding remainder of labs given and notified of elevated estradiol level. Scheduled Pelvic ultrasound  With Dr. Sabra Heck for 09/29/14 at 1300. Patient wishes to learn coverage of procedure, prior to confirming appointment. Routing to cc Kerry Hough to advise patient as soon as possible if appointment needs to be rescheduled.   Cc Dr. Sabra Heck  Routing to provider for final review. Patient agreeable to disposition. Will close encounter.

## 2014-09-21 NOTE — Telephone Encounter (Signed)
Message left to return call to [place] at [phone].   Medication orders pending.

## 2014-09-21 NOTE — Addendum Note (Signed)
Addended by: Michele Mcalpine on: 09/21/2014 01:25 PM   Modules accepted: Orders

## 2014-09-27 ENCOUNTER — Telehealth: Payer: Self-pay | Admitting: Obstetrics & Gynecology

## 2014-09-27 NOTE — Telephone Encounter (Signed)
Called patient to review benefits for procedure. Left voicemail to call back and review. °

## 2014-09-28 NOTE — Telephone Encounter (Signed)
Spoke with patient. Reviewed benefits. Patient understood and agreeable. Verified appt date and time. Ok to close

## 2014-09-29 ENCOUNTER — Ambulatory Visit (INDEPENDENT_AMBULATORY_CARE_PROVIDER_SITE_OTHER): Payer: Managed Care, Other (non HMO) | Admitting: Obstetrics & Gynecology

## 2014-09-29 ENCOUNTER — Encounter: Payer: Self-pay | Admitting: Obstetrics & Gynecology

## 2014-09-29 ENCOUNTER — Ambulatory Visit (INDEPENDENT_AMBULATORY_CARE_PROVIDER_SITE_OTHER): Payer: Managed Care, Other (non HMO)

## 2014-09-29 VITALS — BP 118/80 | HR 64 | Resp 15 | Wt 225.0 lb

## 2014-09-29 DIAGNOSIS — R938 Abnormal findings on diagnostic imaging of other specified body structures: Secondary | ICD-10-CM | POA: Diagnosis not present

## 2014-09-29 DIAGNOSIS — N938 Other specified abnormal uterine and vaginal bleeding: Secondary | ICD-10-CM

## 2014-09-29 DIAGNOSIS — R7989 Other specified abnormal findings of blood chemistry: Secondary | ICD-10-CM

## 2014-09-29 DIAGNOSIS — R9389 Abnormal findings on diagnostic imaging of other specified body structures: Secondary | ICD-10-CM

## 2014-09-29 NOTE — Progress Notes (Signed)
49 y.o. Donna Shepard here for a pelvic ultrasound due to elevated estradiol level noted on lab testing done with AEX 09/16/14 with Debbi Hollice Espy.  Pt having irregular bleeding.  FSH was low but estradiol level was over 600.  Pt here for PUS and discussion of additional testing.    Patient's last menstrual period was 03/30/2014 (approximate).  Sexually active:  yes  Contraception: vasectomy  FINDINGS: UTERUS: 8.5 x 4.7 x 4.0cm with 1.4 x 1.2cm and 0.9cm intramural fibroids EMS: 10.2 mm with cystic spaces, avascular ADNEXA:   Left ovary 2.2 x 1.1 x 1.5cm   Right ovary 4.2 x 2.4 x 2.8MK with 3.4JZ follicle and 7.9XT follicle, both avascular CUL DE SAC: no free fluid  Findings reviewed.  With elevated estradiol level, I want to repeat it today but also obtain a LH level.  Need for additional evaluation with MRI of pituitary gland.  Risks of clotting issues--DVT/PE/stroke/MI discussed.  Pt will start baby ASA today.  Finally feel pt should proceed with endometrial biopsy due to findings on ultrasound.  Verbal and written consent obtained.    Procedure:  Speculum placed.  Cervix visualized and cleansed with betadine prep.  A single toothed tenaculum was applied to the anterior lip of the cervix.  Endometrial pipelle was advanced through the cervix into the endometrial cavity without difficulty.  Pipelle passed to 7.5cm.  Suction applied and pipelle removed with good tissue sample obtained.  Tenculum removed.  No bleeding noted.  Patient tolerated procedure well.  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  Pt will be called with results.  If neg, she needs provera challenge.  If abnormal, will proceed with D&C. Pt advised to start ASA today due to clotting risk.  ~20 minutes spent with patient >50% of time was in face to face discussion of  above.

## 2014-09-30 ENCOUNTER — Telehealth: Payer: Self-pay | Admitting: Certified Nurse Midwife

## 2014-09-30 LAB — ESTRADIOL: Estradiol: 108.6 pg/mL

## 2014-09-30 LAB — LUTEINIZING HORMONE: LH: 2.8 m[IU]/mL

## 2014-09-30 NOTE — Telephone Encounter (Signed)
Called patient and notified her regarding her authorization for her MRI. Notified she is approved for her test and reviewed appointment date and time. Patient understood and agreeable. Ok to close

## 2014-10-03 ENCOUNTER — Telehealth: Payer: Self-pay | Admitting: Certified Nurse Midwife

## 2014-10-03 DIAGNOSIS — R9389 Abnormal findings on diagnostic imaging of other specified body structures: Secondary | ICD-10-CM

## 2014-10-03 DIAGNOSIS — N938 Other specified abnormal uterine and vaginal bleeding: Secondary | ICD-10-CM

## 2014-10-03 NOTE — Telephone Encounter (Signed)
Patient is calling for her results from her last visit.

## 2014-10-03 NOTE — Telephone Encounter (Addendum)
Routing to Dr. Sabra Heck for result note.  Patient scheduled for MRI 09/2614.  Called patient to advise Dr. Sabra Heck has not reviewed results as of yet but that as soon as she does will call her with results. Patient is agreeable.

## 2014-10-04 NOTE — Telephone Encounter (Signed)
Patient cancelled MRI. Cancelled MRI order.   Will order Pelvic ultrasound for work que to be scheduled when patient calls for follow up.  Will close encounter.

## 2014-10-04 NOTE — Telephone Encounter (Signed)
Spoke with pt personally and informed of results.  Pt is aware LH and estradiol levels are normal and I do not recommend MRI at this time.  Aware biopsy showed endometrial polyp.  She is going to take Provera 10mg  x 10days and call back with bleeding response.  Aware will need PUS scheduled after this time to reassess endometrial thickness.  If shows similar findings, recommendation will be make about removal of endometrial polyp.  She does not want to have any surgical procedures if at all possible.  Advised if still showing thickened endometrium and possible polyp, that hysteroscopy with polyp resection will be the recommendation.  States will call back after the Provera to report on bleeding and to scheduled repeat PUS to assess endometrium.  Ok to close encounter.

## 2014-10-07 ENCOUNTER — Other Ambulatory Visit: Payer: 59

## 2014-11-10 ENCOUNTER — Telehealth: Payer: Self-pay | Admitting: Emergency Medicine

## 2014-11-10 NOTE — Telephone Encounter (Signed)
Calling patient to check status. Patient in referral work que and was to complete Provera Challenge ordered 09/21/14 and return call with response and schedule Pelvic ultrasound.   Message left to return call to [place] at [phone].

## 2014-11-15 NOTE — Telephone Encounter (Signed)
Called patient to check status of pelvic ultrasound scheduling. Left message to return call.

## 2014-11-24 NOTE — Telephone Encounter (Signed)
Message left to return call to [place] at [phone].   Dr. Sabra Heck,  Patient has been contacted x 3 with no response. Was to complete provera challenge and repeat Pelvic ultrasound. Please advise.

## 2014-11-28 ENCOUNTER — Encounter: Payer: Self-pay | Admitting: Obstetrics & Gynecology

## 2014-12-21 ENCOUNTER — Encounter: Payer: Self-pay | Admitting: Obstetrics & Gynecology

## 2014-12-21 NOTE — Telephone Encounter (Signed)
Letter printed and to Dr. Ammie Ferrier workstation for signature.

## 2014-12-21 NOTE — Telephone Encounter (Signed)
Letter is written.  Please review and print and I will sign for this to be sent to pt.  Thanks.

## 2014-12-26 NOTE — Telephone Encounter (Signed)
Letter mailed certified [id number].  Will close encounter.

## 2014-12-28 ENCOUNTER — Telehealth: Payer: Self-pay | Admitting: Emergency Medicine

## 2014-12-28 ENCOUNTER — Other Ambulatory Visit (INDEPENDENT_AMBULATORY_CARE_PROVIDER_SITE_OTHER): Payer: Managed Care, Other (non HMO)

## 2014-12-28 DIAGNOSIS — R7989 Other specified abnormal findings of blood chemistry: Secondary | ICD-10-CM

## 2014-12-28 DIAGNOSIS — E559 Vitamin D deficiency, unspecified: Secondary | ICD-10-CM

## 2014-12-28 DIAGNOSIS — R9389 Abnormal findings on diagnostic imaging of other specified body structures: Secondary | ICD-10-CM

## 2014-12-28 NOTE — Telephone Encounter (Signed)
OK to wait until January.  Needs SHGM scheduled.  There is the possibility I will only do a PUS but won't know until I've been how thick the endometrium is.  OK to schedule in January.

## 2014-12-28 NOTE — Telephone Encounter (Signed)
Dr. Sabra Heck,  Patient came today for Vitamin D recheck and I spoke with her about Provera challenge. She took Provera from 10/04/14 to 10/13/14 and began to have bleeding from 10/4 to 10/12. She also reports that she had some spotting on 10/17 to 10/20.   Patient agreeable to scheduling follow up ultrasound, but would prefer to wait until 02/2015 when new insurance benefits coverage starts, however, agrees to schedule if you would like her to plan now. She asks if it is okay to wait until 02/2015, would like to know if needs additional provera? Advised will review with you and return call.

## 2014-12-29 LAB — VITAMIN D 25 HYDROXY (VIT D DEFICIENCY, FRACTURES): Vit D, 25-Hydroxy: 25 ng/mL — ABNORMAL LOW (ref 30–100)

## 2014-12-29 NOTE — Telephone Encounter (Signed)
Spoke with patient and message from Dr. Sabra Heck given. Patient agreeable.  Scheduled for 02/23/15 at 1230 for possible Sonohysterogram with Dr. Sabra Heck.  Order placed for Sonohysterogram for pre-certification.   cc Kerry Hough for insurance pre-certification and patient contact.   Routing to provider for final review. Patient agreeable to disposition. Will close encounter.

## 2015-02-12 HISTORY — PX: COLONOSCOPY: SHX5424

## 2015-02-23 ENCOUNTER — Ambulatory Visit (INDEPENDENT_AMBULATORY_CARE_PROVIDER_SITE_OTHER): Payer: Managed Care, Other (non HMO)

## 2015-02-23 ENCOUNTER — Ambulatory Visit (INDEPENDENT_AMBULATORY_CARE_PROVIDER_SITE_OTHER): Payer: Managed Care, Other (non HMO) | Admitting: Obstetrics & Gynecology

## 2015-02-23 ENCOUNTER — Other Ambulatory Visit: Payer: Self-pay | Admitting: Obstetrics & Gynecology

## 2015-02-23 VITALS — BP 100/80 | HR 62 | Resp 16 | Ht 65.0 in | Wt 226.0 lb

## 2015-02-23 DIAGNOSIS — N938 Other specified abnormal uterine and vaginal bleeding: Secondary | ICD-10-CM

## 2015-02-23 DIAGNOSIS — R7989 Other specified abnormal findings of blood chemistry: Secondary | ICD-10-CM | POA: Diagnosis not present

## 2015-02-23 DIAGNOSIS — R9389 Abnormal findings on diagnostic imaging of other specified body structures: Secondary | ICD-10-CM

## 2015-02-23 DIAGNOSIS — D251 Intramural leiomyoma of uterus: Secondary | ICD-10-CM

## 2015-02-23 DIAGNOSIS — R938 Abnormal findings on diagnostic imaging of other specified body structures: Secondary | ICD-10-CM

## 2015-02-23 NOTE — Progress Notes (Signed)
50 y.o.Marriedfemale here for a pelvic ultrasound with sonohystogram due to thickened endometrium, elevated estradiol level and DUB with menorrhagia.  Pt reports cycles have improved over the past couple of months.  Pt had estradiol value obtained 09/16/14 and the level was >650.  Ultrasound was recommended showing 10.69mm endometrium with cystic spaces.  Biopsy was performed showing fragments of endometrial polyp.  Reviewed with pt that this did not actually show any endometrial tissue.  She subsequently did a Provera challenge and had a heavy cycle after that but the next cycle that was almost 90 days later was much, much lighter and only last about three days.  She's had no intermenstrual bleeding/spotting.   Contraception: vasectomy  Technique:  Both transabdominal and transvaginal ultrasound examinations of the pelvis were performed. Transabdominal technique was performed for global imaging of the pelvis including uterus, ovaries, adnexal regions, and pelvic cul-de-sac.  It was necessary to proceed with endovaginal exam following the abdominal ultrasound transabdominal exam to visualize the endometrium and adnexa.  Color and duplex Doppler ultrasound was utilized to evaluate blood flow to the ovaries.   FINDINGS: Uterus: 7.4 x 4.2 x 3.3cm with 1.7cm and 1.0cm intramural fibroids Endometrium: 3.30mm Adnexa:  Left: 2.4 x 1.4 x 1.5cm with 1.5 x [id number] follicle     Right: 2.1 x 2.0 x 1.2cm Cul de sac: no free fluid  SHSG:  After obtaining appropriate verbal consent from patient, the cervix was visualized using a speculum, and prepped with betadine.  A tenaculum  was applied to the cervix.  Dilation of the cervix was not necessary. The catheter was passed into the uterus and sterile saline introduced, with the following findings: thin endometrium without evidence of polyps or submucosal fibroids  Reviewed findings with pt.  As endometrium is much thinning and pt is now having more regular cycles, I do not  feel we need to repeat the biopsy today.  Will repeat the estradiol level just to ensure that is is lower than the 600 value that was obtained in August.  Pt will continue to use provera 10mg  x 10 days if does not cycle for 90 days.  RF will be done.  She is in agreement with plan and comfortable with results from today.  Assessment: H/O DUB with menorrhagia, improved with cyclic Provera H/O elevated estradiol level, that was normal with follow up H/O endometrial polyp tissue on biopsy.  Endometrium 3.48mm today Two small intramural fibroids  Plan:   Pt will continue to use provera if does not have cycle for 90 days Estradiol level will obtained today Pt will return for AEX with me  ~25 minutes spent with patient >50% of time was in face to face discussion of above.

## 2015-02-24 LAB — ESTRADIOL: Estradiol: 29.6 pg/mL

## 2015-02-26 ENCOUNTER — Encounter: Payer: Self-pay | Admitting: Obstetrics & Gynecology

## 2015-02-26 DIAGNOSIS — D251 Intramural leiomyoma of uterus: Secondary | ICD-10-CM | POA: Insufficient documentation

## 2015-06-12 ENCOUNTER — Telehealth: Payer: Self-pay | Admitting: Obstetrics & Gynecology

## 2015-06-12 ENCOUNTER — Other Ambulatory Visit: Payer: Self-pay

## 2015-06-12 DIAGNOSIS — K209 Esophagitis, unspecified without bleeding: Secondary | ICD-10-CM

## 2015-06-12 DIAGNOSIS — Z1231 Encounter for screening mammogram for malignant neoplasm of breast: Secondary | ICD-10-CM

## 2015-06-12 HISTORY — DX: Esophagitis, unspecified without bleeding: K20.90

## 2015-06-12 NOTE — Telephone Encounter (Signed)
Left message to call [place] at [phone].  Notes from 02/23/2015 [facility] appointment.  Assessment: H/O DUB with menorrhagia, improved with cyclic Provera H/O elevated estradiol level, that was normal with follow up H/O endometrial polyp tissue on biopsy. Endometrium 3.37mm today Two small intramural fibroids  Plan:  Pt will continue to use provera if does not have cycle for 90 days Estradiol level will obtained today Pt will return for AEX with me

## 2015-06-12 NOTE — Telephone Encounter (Signed)
Patient has not had a cycle for 3 months and would like to discuss Provera.

## 2015-06-14 ENCOUNTER — Ambulatory Visit
Admission: RE | Admit: 2015-06-14 | Discharge: 2015-06-14 | Disposition: A | Discharge status: Home / Self Care | Payer: Managed Care, Other (non HMO) | Source: Ambulatory Visit

## 2015-06-14 DIAGNOSIS — Z1231 Encounter for screening mammogram for malignant neoplasm of breast: Secondary | ICD-10-CM

## 2015-06-14 MED ORDER — MEDROXYPROGESTERONE ACETATE 10 MG PO TABS: 10.0000 mg | ORAL_TABLET | Freq: Every day | ORAL | Status: DC

## 2015-06-14 NOTE — Telephone Encounter (Signed)
Spoke with patient. Patient states that she has not had a cycle in 3 months and will need a prescription for Provera. Per OV note dated 02/23/2015 the patient is to continue taking provera if she does not cycle for 90 days. Advised rx for Provera 10 mg x 10 days take 1 tablets per day #10 0RF has been sent to her pharmacy on file. Advised she will need to monitor for any bleeding for 14 days following taking the last dose of the Provera. She is agreeable and will contact the office with results.  Routing to provider for final review. Patient agreeable to disposition. Will close encounter.

## 2015-06-23 LAB — HM COLONOSCOPY

## 2015-07-26 ENCOUNTER — Telehealth: Payer: Self-pay | Admitting: Obstetrics & Gynecology

## 2015-07-26 NOTE — Telephone Encounter (Signed)
Patient is requesting to talk with Dr.Miller's nurse. Patient did not start the provera challenge, however did start her cycle on her own.

## 2015-07-26 NOTE — Telephone Encounter (Signed)
I agree with your plan.  You may close the encounter.

## 2015-07-26 NOTE — Telephone Encounter (Signed)
Patient was seen in the office on 02/23/2015 for [facility]. Please see note below. Patient called the office on 06/12/2015 stating she had not cycled in 3 months and was prescribed Provera. Spoke with patient. Patient states she did not take Provera in May as she was due to have a colonoscopy and did not want to start the medication before that appointment. Patient started her menses this Monday 07/24/2015 without taking Provera. Reports bleeding is like her "normal" cycle. Denies any heavy bleeding. Advised she will need to monitor her cycle. If she has increased bleeding where she is having to change her pad/tampon every hour due to soaking through she will need to contact the office. If she experiences prolonged bleeding will also need to contact the office. Will need to monitor her cycles. If she does not have a cycle for 3 months following this cycle she will need to notify the office. She is agreeable.  Assessment: H/O DUB with menorrhagia, improved with cyclic Provera H/O elevated estradiol level, that was normal with follow up H/O endometrial polyp tissue on biopsy. Endometrium 3.83mm today Two small intramural fibroids  Plan:  Pt will continue to use provera if does not have cycle for 90 days Estradiol level will obtained today Pt will return for AEX with   Dr.Silva, do you agree with plan?

## 2015-09-19 ENCOUNTER — Ambulatory Visit: Payer: Managed Care, Other (non HMO) | Admitting: Certified Nurse Midwife

## 2015-09-19 ENCOUNTER — Encounter: Payer: Self-pay | Admitting: Obstetrics & Gynecology

## 2015-09-19 ENCOUNTER — Ambulatory Visit (INDEPENDENT_AMBULATORY_CARE_PROVIDER_SITE_OTHER): Payer: BLUE CROSS/BLUE SHIELD | Admitting: Obstetrics & Gynecology

## 2015-09-19 VITALS — BP 128/74 | HR 66 | Resp 16 | Ht 65.25 in | Wt 231.6 lb

## 2015-09-19 DIAGNOSIS — Z1151 Encounter for screening for human papillomavirus (HPV): Secondary | ICD-10-CM | POA: Diagnosis not present

## 2015-09-19 DIAGNOSIS — Z0000 Encounter for general adult medical examination without abnormal findings: Secondary | ICD-10-CM

## 2015-09-19 DIAGNOSIS — N951 Menopausal and female climacteric states: Secondary | ICD-10-CM | POA: Diagnosis not present

## 2015-09-19 DIAGNOSIS — Z01419 Encounter for gynecological examination (general) (routine) without abnormal findings: Secondary | ICD-10-CM | POA: Diagnosis not present

## 2015-09-19 DIAGNOSIS — Z124 Encounter for screening for malignant neoplasm of cervix: Secondary | ICD-10-CM | POA: Diagnosis not present

## 2015-09-19 LAB — COMPREHENSIVE METABOLIC PANEL
ALT: 25 U/L (ref 6–29)
AST: 25 U/L (ref 10–35)
Albumin: 4 g/dL (ref 3.6–5.1)
Alkaline Phosphatase: 95 U/L (ref 33–130)
BUN: 12 mg/dL (ref 7–25)
CO2: 23 mmol/L (ref 20–31)
Calcium: 9.5 mg/dL (ref 8.6–10.4)
Chloride: 102 mmol/L (ref 98–110)
Creat: 0.94 mg/dL (ref 0.50–1.05)
Glucose, Bld: 82 mg/dL (ref 65–99)
Potassium: 4.4 mmol/L (ref 3.5–5.3)
Sodium: 139 mmol/L (ref 135–146)
Total Bilirubin: 0.4 mg/dL (ref 0.2–1.2)
Total Protein: 6.8 g/dL (ref 6.1–8.1)

## 2015-09-19 LAB — LIPID PANEL
Cholesterol: 182 mg/dL (ref 125–200)
HDL: 71 mg/dL (ref >=46)
LDL Cholesterol: 98 mg/dL (ref <130)
Total CHOL/HDL Ratio: 2.6 Ratio (ref <=5.0)
Triglycerides: 64 mg/dL (ref <150)
VLDL: 13 mg/dL (ref <30)

## 2015-09-19 LAB — TSH: TSH: 2.07 mIU/L

## 2015-09-19 NOTE — Progress Notes (Signed)
50 y.o. G65P2002 Married Caucasian F here for annual exam.  Pt's cycles are becoming less frequent.  She went almost 3 months but then cycles, so she hasn't had to take the Progesterone.  When she cycles, flow lasts about a week and wasn't heavy.    Patient's last menstrual period was 07/22/2015.          Sexually active: Yes.    The current method of family planning is vasectomy.    Exercising: Yes.    swimming, water aerobics, walking Smoker:  no  Health Maintenance: Pap:  08/27/2013 negative  History of abnormal Pap:  no MMG:  06/15/2015 BIRADS 1 negative  Colonoscopy:  06/2015 polyps repeat 5 years, Dr. Collene Mares BMD:   never TDaP:  08/13/2011  Pneumonia vaccine(s):  never Zostavax:   never Hep C testing: not indicated  Screening Labs: drawn today, Hb today: drawn today, Urine today: declined   reports that she has never smoked. She has never used smokeless tobacco. She reports that she does not drink alcohol or use drugs.  Past Medical History:  Diagnosis Date  . Migraines    no aura  . Syncopal episodes    situational    No past surgical history on file.  Current Outpatient Prescriptions  Medication Sig Dispense Refill  . medroxyPROGESTERone (PROVERA) 10 MG tablet Take 1 tablet (10 mg total) by mouth daily. 10 tablet 0  . Multiple Vitamins-Minerals (MULTIVITAMIN PO) Take by mouth daily.    . Vitamin D, Ergocalciferol, (DRISDOL) 50000 UNITS CAPS capsule Take 1 capsule (50,000 Units total) by mouth every 7 (seven) days. 8 capsule 0   No current facility-administered medications for this visit.     Family History  Problem Relation Age of Onset  . Hypertension Mother   . Depression Mother   . Hypertension Father   . Depression Father   . Depression Brother   . Hypertension Maternal Grandmother     ROS:  Pertinent items are noted in HPI.  Otherwise, a comprehensive ROS was negative.  Exam:   BP 128/74 (BP Location: Right Arm, Patient Position: Sitting)   Pulse 66   Resp  16   Ht 5' 5.25" (1.657 m)   Wt 231 lb 9.6 oz (105.1 kg)   LMP 07/22/2015   BMI 38.25 kg/m    Height: 5' 5.25" (165.7 cm)  Ht Readings from Last 3 Encounters:  09/19/15 5' 5.25" (1.657 m)  02/23/15 5\' 5"  (1.651 m)  09/16/14 5\' 5"  (1.651 m)    General appearance: alert, cooperative and appears stated age Head: Normocephalic, without obvious abnormality, atraumatic Neck: no adenopathy, supple, symmetrical, trachea midline and thyroid normal to inspection and palpation Lungs: clear to auscultation bilaterally Breasts: normal appearance, no masses or tenderness Heart: regular rate and rhythm Abdomen: soft, non-tender; bowel sounds normal; no masses,  no organomegaly Extremities: extremities normal, atraumatic, no cyanosis or edema Skin: Skin color, texture, turgor normal. No rashes or lesions Lymph nodes: Cervical, supraclavicular, and axillary nodes normal. No abnormal inguinal nodes palpated Neurologic: Grossly normal   Pelvic: External genitalia:  no lesions              Urethra:  normal appearing urethra with no masses, tenderness or lesions              Bartholins and Skenes: normal                 Vagina: normal appearing vagina with normal color and discharge, no lesions  Cervix: no lesions              Pap taken: Yes.   Bimanual Exam:  Uterus:  normal size, contour, position, consistency, mobility, non-tender              Adnexa: normal adnexa and no mass, fullness, tenderness               Rectovaginal: Confirms               Anus:  normal sphincter tone, no lesions  Chaperone was present for exam.  A:  Well Woman with normal exam Perimenopausal H/O elevated estradiol level but repeat was normal H/O Vit deficiency H/O endometrial biopy 8/16 with findings c/w polyp  P:   Mammogram yearly recommended pap smear with HR HPV CMP, Lipids, Vit D, TSH Repeat Estradiol level today Pt has provera rx for provera challenge if she goes >90 days between  cycles Return annually or prn

## 2015-09-20 LAB — VITAMIN D 25 HYDROXY (VIT D DEFICIENCY, FRACTURES): Vit D, 25-Hydroxy: 20 ng/mL — ABNORMAL LOW (ref 30–100)

## 2015-09-20 LAB — ESTRADIOL: Estradiol: 98 pg/mL

## 2015-09-22 LAB — IPS PAP TEST WITH HPV

## 2015-09-25 ENCOUNTER — Telehealth: Payer: Self-pay | Admitting: *Deleted

## 2015-09-25 ENCOUNTER — Other Ambulatory Visit: Payer: Self-pay | Admitting: Obstetrics & Gynecology

## 2015-09-25 MED ORDER — VITAMIN D (ERGOCALCIFEROL) 1.25 MG (50000 UNIT) PO CAPS: 50000.0000 [IU] | ORAL_CAPSULE | ORAL | 2 refills | Status: DC

## 2015-09-25 NOTE — Telephone Encounter (Signed)
Message left to return call to Danai Gotto at [phone].    

## 2015-09-25 NOTE — Telephone Encounter (Signed)
Patient returning call.

## 2015-09-25 NOTE — Telephone Encounter (Signed)
Patient notified of lab results. Patient verbalized understanding. Patient aware that prescription Vitamin D was called in to her pharmacy. Instructions for use given. Patient aware to switch to 1000 IU after 12 weeks. Patient states she is no symptomatic of yeast so no treatment. Aware to call the office if she begins having an yeast symptoms.

## 2015-11-02 ENCOUNTER — Encounter: Payer: Self-pay | Admitting: Obstetrics & Gynecology

## 2015-11-02 ENCOUNTER — Ambulatory Visit (INDEPENDENT_AMBULATORY_CARE_PROVIDER_SITE_OTHER): Payer: BLUE CROSS/BLUE SHIELD | Admitting: Obstetrics & Gynecology

## 2015-11-02 VITALS — BP 100/70 | HR 66 | Temp 97.9°F | Resp 18 | Ht 65.25 in | Wt 233.0 lb

## 2015-11-02 DIAGNOSIS — N898 Other specified noninflammatory disorders of vagina: Secondary | ICD-10-CM | POA: Diagnosis not present

## 2015-11-02 DIAGNOSIS — L292 Pruritus vulvae: Secondary | ICD-10-CM | POA: Diagnosis not present

## 2015-11-02 MED ORDER — BETAMETHASONE DIPROPIONATE 0.05 % EX CREA: TOPICAL_CREAM | Freq: Two times a day (BID) | CUTANEOUS | 0 refills | Status: DC

## 2015-11-02 MED ORDER — FLUCONAZOLE 200 MG PO TABS: ORAL_TABLET | ORAL | 0 refills | Status: DC

## 2015-11-02 NOTE — Progress Notes (Signed)
GYNECOLOGY  VISIT   HPI: 50 y.o. G71P2002 Married Caucasian female with increased vaginal and vulvar itching for two days.  She's also having some discharge.  Has not been on on recent antibiotics.  Denies urinary symptoms.  Reports her cycles have decreased in frequency.  Has been 90 days and she knows to start the Provera.  Had rx on hand and doesn't need one.    Patient's last menstrual period was 08/01/2015.    GYNECOLOGIC HISTORY: Patient's last menstrual period was 08/01/2015. Contraception: Vasectomy  Menopausal hormone therapy: None  Patient Active Problem List   Diagnosis Date Noted  . Fibroids, intramural 02/26/2015  . High serum estradiol 09/29/2014    Past Medical History:  Diagnosis Date  . Esophagitis 06/2015   after 06/2015 endoscopy   . Migraines    no aura  . Syncopal episodes    situational    No past surgical history on file.  MEDS:  Reviewed in EPIC and UTD  ALLERGIES: Penicillins  Family History  Problem Relation Age of Onset  . Hypertension Mother   . Depression Mother   . Hypertension Father   . Depression Father   . Depression Brother   . Hypertension Maternal Grandmother     SH:  Married, non smoker  Review of Systems  Constitutional: Negative.   HENT: Negative.   Eyes: Negative.   Respiratory: Negative.   Cardiovascular: Negative.   Gastrointestinal: Negative.   Genitourinary:       Discharge Vaginal itching  Musculoskeletal: Negative.   Skin: Negative.   Neurological: Negative.   Endo/Heme/Allergies: Negative.   Psychiatric/Behavioral: Negative.     PHYSICAL EXAMINATION:    BP 100/70 (BP Location: Right Arm, Patient Position: Sitting, Cuff Size: Large)   Pulse 66   Temp 97.9 F (36.6 C) (Oral)   Resp 18   Ht 5' 5.25" (1.657 m)   Wt 233 lb (105.7 kg)   LMP 08/01/2015   BMI 38.48 kg/m     General appearance: alert, cooperative and appears stated age Abdomen: soft, non-tender; bowel sounds normal; no masses,  no  organomegaly  Pelvic: External genitalia:  no lesions              Urethra:  normal appearing urethra with no masses, tenderness or lesions              Bartholins and Skenes: normal                 Vagina: normal appearing vagina with normal color and watery discharge, no lesion              Cervix: no lesions              Bimanual Exam:  Uterus:  normal size, contour, position, consistency, mobility, non-tender              Adnexa: no mass, fullness, tenderness              Anus:  normal sphincter tone, no lesions  Chaperone was present for exam.  Assessment: Vaginal discharge and vulvar itching  Plan: Affirm pending Diflucan 200mg  po x 1, repeat 72 hrs.  Insurance only allows for one 150mg  dosage at at time. Betamethasone 0.05% cream bid until itching resolves.  Pt knows not to use for more than 7 days.  Rx to pharmacy.

## 2015-11-03 ENCOUNTER — Other Ambulatory Visit: Payer: Self-pay | Admitting: Obstetrics & Gynecology

## 2015-11-03 LAB — WET PREP BY MOLECULAR PROBE
Candida species: POSITIVE — AB
Gardnerella vaginalis: POSITIVE — AB
Trichomonas vaginosis: NEGATIVE

## 2015-11-03 MED ORDER — METRONIDAZOLE 0.75 % VA GEL: 1.0000 | Freq: Every day | VAGINAL | 0 refills | Status: DC

## 2015-12-16 DIAGNOSIS — M542 Cervicalgia: Secondary | ICD-10-CM | POA: Diagnosis not present

## 2015-12-20 ENCOUNTER — Other Ambulatory Visit: Payer: Self-pay | Admitting: Obstetrics & Gynecology

## 2015-12-20 NOTE — Telephone Encounter (Signed)
Medication refill request: Vitamin D  Last AEX:  11-02-15  Next AEX: 12-24-16  Last MMG (if hormonal medication request): 06-15-15 WNL  Refill authorized: please advise

## 2016-01-06 DIAGNOSIS — Z23 Encounter for immunization: Secondary | ICD-10-CM | POA: Diagnosis not present

## 2016-09-13 ENCOUNTER — Other Ambulatory Visit: Payer: Self-pay | Admitting: Obstetrics & Gynecology

## 2016-09-13 DIAGNOSIS — Z1231 Encounter for screening mammogram for malignant neoplasm of breast: Secondary | ICD-10-CM

## 2016-09-20 ENCOUNTER — Ambulatory Visit
Admission: RE | Admit: 2016-09-20 | Discharge: 2016-09-20 | Disposition: A | Discharge status: Home / Self Care | Payer: BLUE CROSS/BLUE SHIELD | Source: Ambulatory Visit | Attending: Obstetrics & Gynecology | Admitting: Obstetrics & Gynecology

## 2016-09-20 DIAGNOSIS — Z1231 Encounter for screening mammogram for malignant neoplasm of breast: Secondary | ICD-10-CM

## 2016-09-26 DIAGNOSIS — K219 Gastro-esophageal reflux disease without esophagitis: Secondary | ICD-10-CM | POA: Diagnosis not present

## 2016-09-26 DIAGNOSIS — K200 Eosinophilic esophagitis: Secondary | ICD-10-CM | POA: Diagnosis not present

## 2016-11-26 ENCOUNTER — Ambulatory Visit (INDEPENDENT_AMBULATORY_CARE_PROVIDER_SITE_OTHER): Payer: BLUE CROSS/BLUE SHIELD | Admitting: Internal Medicine

## 2016-11-26 ENCOUNTER — Encounter: Payer: Self-pay | Admitting: Internal Medicine

## 2016-11-26 DIAGNOSIS — E559 Vitamin D deficiency, unspecified: Secondary | ICD-10-CM | POA: Diagnosis not present

## 2016-11-26 DIAGNOSIS — Z23 Encounter for immunization: Secondary | ICD-10-CM

## 2016-11-26 LAB — VITAMIN D 25 HYDROXY (VIT D DEFICIENCY, FRACTURES): VITD: 27.3 ng/mL — ABNORMAL LOW (ref 30.00–100.00)

## 2016-11-26 NOTE — Patient Instructions (Signed)
Please stop at the lab.  Continue Ergocalciferol 50,000 units weekly.  We will schedule a new appt if the labs are abnormal.  Hypercalcemia Hypercalcemia is having too much calcium in the blood. The body needs calcium to make bones and keep them strong. Calcium also helps the muscles, nerves, brain, and heart work the way they should. Most of the calcium in the body is in the bones. There is also some calcium in the blood. Hypercalcemia can happen when calcium comes out of the bones, or when the kidneys are not able to remove calcium from the blood. Hypercalcemia can be mild or severe. What are the causes? There are many possible causes of hypercalcemia. Common causes include:  Hyperparathyroidism. This is a condition in which the body produces too much parathyroid hormone. There are four parathyroid glands in your neck. These glands produce a chemical messenger (hormone) that helps the body absorb calcium from foods and helps your bones release calcium.  Certain kinds of cancer, such as lung cancer, breast cancer, or myeloma.  Less common causes of hypercalcemia include:  Getting too much calcium or vitamin D from your diet.  Kidney failure.  Hyperthyroidism.  Being on bed rest for a long time.  Certain medicines.  Infections.  Sarcoidosis.  What increases the risk? This condition is more likely to develop in:  Women.  People who are 60 years or older.  People who have a family history of hypercalcemia.  What are the signs or symptoms? Mild hypercalcemia that starts slowly may not cause symptoms. Severe, sudden hypercalcemia is more likely to cause symptoms, such as:  Loss of appetite.  Increased thirst and frequent urination.  Fatigue.  Nausea and vomiting.  Headache.  Abdominal pain.  Muscle pain, twitching, or weakness.  Constipation.  Blood in the urine.  Pain in the side of the back (flank pain).  Anxiety, confusion, or depression.  Irregular  heartbeat (arrhythmia).  Loss of consciousness.  How is this diagnosed? This condition may be diagnosed based on:  Your symptoms.  Blood tests.  Urine tests.  X-rays.  Ultrasound.  MRI.  CT scan.  How is this treated? Treatment for hypercalcemia depends on the cause. Treatment may include:  Receiving fluids through an IV tube.  Medicines that keep calcium levels steady after receiving fluids (loop diuretics).  Medicines that keep calcium in your bones (bisphosphonates).  Medicines that lower the calcium level in your blood.  Surgery to remove overactive parathyroid glands.  Follow these instructions at home:  Take over-the-counter and prescription medicines only as told by your health care provider.  Follow instructions from your health care provider about eating or drinking restrictions.  Drink enough fluid to keep your urine clear or pale yellow.  Stay active. Weight-bearing exercise helps to keep calcium in your bones. Follow instructions from your health care provider about what type and level of exercise is safe for you.  Keep all follow-up visits as told by your health care provider. This is important. Contact a health care provider if:  You have a fever.  You have flank or abdominal pain that is getting worse. Get help right away if:  You have severe abdominal or flank pain.  You have chest pain.  You have trouble breathing.  You become very confused and sleepy.  You lose consciousness. This information is not intended to replace advice given to you by your health care provider. Make sure you discuss any questions you have with your health care provider. Document  Released: 04/13/2004 Document Revised: 07/06/2015 Document Reviewed: 06/15/2014 Elsevier Interactive Patient Education  [employer].

## 2016-11-26 NOTE — Progress Notes (Signed)
Patient ID: Donna Shepard, female   DOB: 1965/05/03, 51 y.o.   MRN: [id number]   HPI  Donna Shepard is a 51 y.o.-year-old female, referred by Dr. Collene Shepard, for evaluation for hypercalcemia.  Pt was dx with hypercalcemia in 09/2016.  She describes acid reflux >> initially on Omeprazole but then ran out >> started Tums 2 days before labs. Now back on Omeprazole.   I reviewed pt's pertinent labs: 09/27/2016 (Dr. Lorie Shepard office): Ca 11.7 (albumin 4.5 (3.5-5.5), BUN/Cr 14/0.94 Lab Results  Component Value Date   CALCIUM 9.5 09/19/2015   CALCIUM 9.6 09/16/2014   CALCIUM 9.4 08/26/2012   No h/o osteoporosis or osteopenia. No fractures.  No DEXA scans available for review.  No h/o kidney stones.  No h/o CKD. Last BUN/Cr: 09/27/2016 (Dr. Lorie Shepard office): BUN/Cr 14/0.94 Lab Results  Component Value Date   BUN 12 09/19/2015   BUN 15 09/16/2014   CREATININE 0.94 09/19/2015   CREATININE 0.89 09/16/2014   Pt is not on HCTZ.  She has a h/o vitamin D deficiency. Reviewed vit D levels: Lab Results  Component Value Date   VD25OH 20 (L) 09/19/2015   VD25OH 25 (L) 12/28/2014   VD25OH 16 (L) 09/16/2014   Pt was started on Ergocalciferol 50,000 IU weekly; she also eats dairy and green, leafy, vegetables.  Pt does not have a FH of hypercalcemia, pituitary tumors, thyroid cancer. Mother with osteoporosis. Brother with kidney stones.  She has a history of eosinophilic esophagitis.  ROS: Constitutional: + weight gain/loss, no fatigue, no subjective hyperthermia/hypothermia Eyes: no blurry vision, no xerophthalmia ENT: no sore throat, no nodules palpated in throat, no dysphagia/odynophagia, no hoarseness Cardiovascular: no CP/SOB/palpitations/leg swelling Respiratory: no cough/SOB Gastrointestinal: no N/V/D/C/+ acid reflux Musculoskeletal: no muscle/+ joint aches Skin: no rashes Neurological: no tremors/numbness/tingling/dizziness Psychiatric: no depression/anxiety  Past Medical  History:  Diagnosis Date  . Esophagitis 06/2015   after 06/2015 endoscopy   . Migraines    no aura  . Syncopal episodes    situational   No past surgical history on file. Social History   Social History  . Marital status: Married    Spouse name: N/A  . Number of children: 2   Occupational History  . CPA   Social History Main Topics  . Smoking status: Never Smoker  . Smokeless tobacco: Never Used  . Alcohol use Yes, beer/wine - 1 drink 1-2x a mo  . Drug use: No  . Sexual activity: Yes    Partners: Male     Comment: vasectomy   Current Outpatient Prescriptions on File Prior to Visit  Medication Sig Dispense Refill  . Aspirin-Acetaminophen-Caffeine (EXCEDRIN PO) Take by mouth.    Marland Kitchen ibuprofen (ADVIL,MOTRIN) 200 MG tablet Take 200 mg by mouth every 6 (six) hours as needed.    Marland Kitchen omeprazole (PRILOSEC) 40 MG capsule 1 CAP(S) ONCE A DAY 20 MINUTES BEFORE BREAKFAST ORALLY 30 DAY(S)  12  . Vitamin D, Ergocalciferol, (DRISDOL) 50000 units CAPS capsule TAKE 1 CAPSULE (50,000 UNITS TOTAL) BY MOUTH EVERY 7 (SEVEN) DAYS. 4 capsule 12  . betamethasone dipropionate (DIPROLENE) 0.05 % cream Apply topically 2 (two) times daily. (Patient not taking: Reported on 11/26/2016) 30 g 0  . fluconazole (DIFLUCAN) 200 MG tablet 1 tab po x 1 dose, repeat in 3 days if needed (Patient not taking: Reported on 11/26/2016) 2 tablet 0  . medroxyPROGESTERone (PROVERA) 10 MG tablet Take 1 tablet (10 mg total) by mouth daily. (Patient not taking: Reported on 11/02/2015) 10 tablet 0  .  metroNIDAZOLE (METROGEL) 0.75 % vaginal gel Place 1 Applicatorful vaginally at bedtime. (Patient not taking: Reported on 11/26/2016) 70 g 0  . Multiple Vitamins-Minerals (MULTIVITAMIN PO) Take by mouth daily.     No current facility-administered medications on file prior to visit.    Allergies  Allergen Reactions  . Penicillins Rash   Family History  Problem Relation Age of Onset  . Hypertension Mother   . Depression Mother    . Hypertension Father   . Depression Father   . Depression Brother   . Hypertension Maternal Grandmother     PE: BP 122/80 (BP Location: Left Arm, Patient Position: Sitting)   Pulse 66   Wt 234 lb (106.1 kg)   LMP 11/03/2016   SpO2 98%   BMI 38.64 kg/m  Wt Readings from Last 3 Encounters:  11/26/16 234 lb (106.1 kg)  11/02/15 233 lb (105.7 kg)  09/19/15 231 lb 9.6 oz (105.1 kg)   Constitutional: overweight, in NAD. No kyphosis. Eyes: PERRLA, EOMI, no exophthalmos ENT: moist mucous membranes, no thyromegaly, no cervical lymphadenopathy Cardiovascular: RRR, No MRG Respiratory: CTA B Gastrointestinal: abdomen soft, NT, ND, BS+ Musculoskeletal: no deformities, strength intact in all 4 Skin: moist, warm, no rashes Neurological: no tremor with outstretched hands, DTR normal in all 4  Assessment: 1. Hypercalcemia  2. Vitamin D deficiency  Plan: 1 and 2. Patient has had 1 instance of elevated calcium, at 11.4 (8.7-10.2). Her suspicion is that the Tums tablets that she just started to take at the time of the check are the culprits for the elevated value and I agree, especially since all her previous calcium levels have been normal. However, we still need to make sure there is no other HCa etiology.  - Patient also  has vitamin D deficiency,  with the last level being 20 1 year ago. Since then, she started high dose weekly Ergocalciferol. She may miss doses. - No apparent complications from hypercalcemia: no h/o nephrolithiasis, no osteoporosis, no fractures. No abdominal pain, depression, bone pain. - I discussed with the patient about the physiology of calcium and parathyroid hormone, and possible side effects from increased PTH, including kidney stones, osteoporosis, abdominal pain, etc.  - We discussed that we need to check whether her hyperparathyroidism is primary (Familial hypercalcemic hypocalciuria or parathyroid adenoma -I doubt 2/2 previous normal Ca levels) or secondary  (to conditions like: vitamin D deficiency, calcium malabsorption, hypercalciuria, renal insufficiency, etc.). - I discussed with her that we first need to bring her vitamin D level to normal so we can further investigate the parathyroid status. I explained that in the setting of a low vitamin D, the parathyroid hormone can be elevated, which is not a pathologic finding. However, if the PTH is elevated in the setting of a normal vitamin D, we will further need to investigate her for primary or secondary hyperparathyroidism. - after we normalize the vitamin D level, we'll need to check/re-check: calcium level intact PTH ([facility]) Magnesium Phosphorus vitamin D- 25 HO and 1,25 HO 24h urinary calcium/creatinine ratio  - We discussed possible consequences of hyperparathyroidism: ~1/3 pts will develop complications over 15 years (OP, nephrolithiasis).  - If the tests indicate a parathyroid adenoma, she agrees with a referral to surgery. I explained what this entails. - criteria for parathyroid surgery:  Increased calcium by more than 1 mg/dL above the upper limit of normal  Kidney ds.  Osteoporosis (or Vb fx) Age <63 years old New (2013): High UCa >400 mg/d and increased stone  risk by biochemical stone risk analysis Presence of nephrolithiasis or nephrocalcinosis Pt's preference - We may need to check a DEXA scan to see if she has osteoporosis (+ add a 33% distal radius for evaluation of cortical bone, which is predominantly affected by hyperparathyroidism).  -I will advise her about vitamin D supplement dose when the results of the vitamin D level are back. She tells me she skipped Ergocalciferol doses, so if the level is slightly low, we may need to have her take it consistently before increasing the dose. - gave her the flu shot today - I will see the patient back if results are abnormal  Component     Latest Ref Rng & Units 11/26/2016  Calcium     8.7 - 10.2 mg/dL 9.4  PTH, Intact     15  - 65 pg/mL 37  VITD     30.00 - 100.00 ng/mL 27.30 (L)   Ca and PTH normal >> c/w transient hypercalcemia 2/2 increased calcium intake (Tums) Slightly low vit D, but improved >> would suggest to continue Ergocalciferol weekly. May need to stay on this long term.  Philemon Kingdom, MD PhD [facility] Endocrinology

## 2016-11-27 LAB — PTH, INTACT AND CALCIUM
Calcium: 9.4 mg/dL (ref 8.7–10.2)
PTH: 37 pg/mL (ref 15–65)

## 2016-11-28 DIAGNOSIS — E559 Vitamin D deficiency, unspecified: Secondary | ICD-10-CM | POA: Insufficient documentation

## 2016-12-24 ENCOUNTER — Encounter: Payer: Self-pay | Admitting: Obstetrics & Gynecology

## 2016-12-24 ENCOUNTER — Ambulatory Visit (INDEPENDENT_AMBULATORY_CARE_PROVIDER_SITE_OTHER): Payer: BLUE CROSS/BLUE SHIELD | Admitting: Obstetrics & Gynecology

## 2016-12-24 ENCOUNTER — Other Ambulatory Visit: Payer: Self-pay

## 2016-12-24 VITALS — BP 120/80 | HR 68 | Resp 16 | Ht 65.0 in | Wt 236.0 lb

## 2016-12-24 DIAGNOSIS — Z01419 Encounter for gynecological examination (general) (routine) without abnormal findings: Secondary | ICD-10-CM

## 2016-12-24 MED ORDER — VITAMIN D (ERGOCALCIFEROL) 1.25 MG (50000 UNIT) PO CAPS: 50000.0000 [IU] | ORAL_CAPSULE | ORAL | 4 refills | Status: DC

## 2016-12-24 MED ORDER — MEDROXYPROGESTERONE ACETATE 10 MG PO TABS: 10.0000 mg | ORAL_TABLET | Freq: Every day | ORAL | 0 refills | Status: DC

## 2016-12-24 NOTE — Patient Instructions (Signed)
Check with insurance about the Shingrix (new shingles) vaccine.

## 2016-12-24 NOTE — Progress Notes (Signed)
51 y.o. [id number] MarriedCaucasianF here for annual exam.  Doing well.  Saw endocrinology this year for hypercalcemia.  Pt reports she was having a lot of GERD and taking a lot of Tums.  Blood work was abnormal and referred to Dr. Cruzita Lederer.  Stopped taking so many Tums and follow up blood work was normal.  Vit D was a little low.    Cycling about every 2 1/2 to 3 months.  Flow is normal and lasts about 6-7 days.  Not heavy.  Patient's last menstrual period was 11/04/2016.          Sexually active: Yes.    The current method of family planning is vasectomy.    Exercising: Yes.    walking, swim Smoker:  no  Health Maintenance: Pap:  09/19/15 Neg. HR HPV:neg   08/27/13 Neg  History of abnormal Pap:  no MMG:  09/23/16 BIRADS1:neg  Colonoscopy:  06/2015 polyps. F/u 5 years  BMD:   Never TDaP:  2013 Pneumonia vaccine(s):  No Zostavax:   No Screening Labs: not due yet   reports that  has never smoked. she has never used smokeless tobacco. She reports that she does not drink alcohol or use drugs.  Past Medical History:  Diagnosis Date  . Esophagitis 06/2015   after 06/2015 endoscopy   . Migraines    no aura  . Syncopal episodes    situational    History reviewed. No pertinent surgical history.  Current Outpatient Medications  Medication Sig Dispense Refill  . Aspirin-Acetaminophen-Caffeine (EXCEDRIN PO) Take by mouth.    Marland Kitchen ibuprofen (ADVIL,MOTRIN) 200 MG tablet Take 200 mg by mouth every 6 (six) hours as needed.    Marland Kitchen omeprazole (PRILOSEC) 40 MG capsule 1 CAP(S) ONCE A DAY 20 MINUTES BEFORE BREAKFAST ORALLY 30 DAY(S)  12  . Vitamin D, Ergocalciferol, (DRISDOL) 50000 units CAPS capsule TAKE 1 CAPSULE (50,000 UNITS TOTAL) BY MOUTH EVERY 7 (SEVEN) DAYS. 4 capsule 12   No current facility-administered medications for this visit.     Family History  Problem Relation Age of Onset  . Hypertension Mother   . Depression Mother   . Hypertension Father   . Depression Father   . Depression  Brother   . Hypertension Maternal Grandmother     ROS:  Pertinent items are noted in HPI.  Otherwise, a comprehensive ROS was negative.  Exam:   BP 120/80 (BP Location: Right Arm, Patient Position: Sitting, Cuff Size: Large)   Pulse 68   Resp 16   Ht 5\' 5"  (1.651 m)   Wt 236 lb (107 kg)   LMP 11/04/2016   BMI 39.27 kg/m      Height: 5\' 5"  (165.1 cm)  Ht Readings from Last 3 Encounters:  12/24/16 5\' 5"  (1.651 m)  11/02/15 5' 5.25" (1.657 m)  09/19/15 5' 5.25" (1.657 m)    General appearance: alert, cooperative and appears stated age Head: Normocephalic, without obvious abnormality, atraumatic Neck: no adenopathy, supple, symmetrical, trachea midline and thyroid normal to inspection and palpation Lungs: clear to auscultation bilaterally Breasts: normal appearance, no masses or tenderness Heart: regular rate and rhythm Abdomen: soft, non-tender; bowel sounds normal; no masses,  no organomegaly Extremities: extremities normal, atraumatic, no cyanosis or edema Skin: Skin color, texture, turgor normal. No rashes or lesions Lymph nodes: Cervical, supraclavicular, and axillary nodes normal. No abnormal inguinal nodes palpated Neurologic: Grossly normal   Pelvic: External genitalia:  no lesions  Urethra:  normal appearing urethra with no masses, tenderness or lesions              Bartholins and Skenes: normal                 Vagina: normal appearing vagina with normal color and discharge, no lesions              Cervix: no lesions              Pap taken: No. Bimanual Exam:  Uterus:  normal size, contour, position, consistency, mobility, non-tender              Adnexa: normal adnexa and no mass, fullness, tenderness               Rectovaginal: Confirms               Anus:  normal sphincter tone, no lesions  Chaperone was present for exam.  A:  Well Woman with normal exam Perimenopausal Vit D deficiency  P:   Mammogram guidelines reviewed.  Discussed 3D. pap  smear not indicated.  Neg with neg HR HPV 2017 Vit D just obtained in October.  RF for Vit D rx given.  Dr. Cruzita Lederer recommended long term use.  Will check next year. D/w pt timing for provera challenge if does not cycle for >90 days.  Rx for Provera 10mg  x 10 days given for her to keep on hand.  She knows to call if uses this rx.   Shingrix discussed.  She is going to check with insurance about coverage.  Is interested in receiving it. RTO 1 year or prn new issues.

## 2017-12-18 DIAGNOSIS — Z86010 Personal history of colonic polyps: Secondary | ICD-10-CM | POA: Diagnosis not present

## 2017-12-18 DIAGNOSIS — K219 Gastro-esophageal reflux disease without esophagitis: Secondary | ICD-10-CM | POA: Diagnosis not present

## 2018-01-02 ENCOUNTER — Other Ambulatory Visit: Payer: Self-pay | Admitting: Obstetrics & Gynecology

## 2018-01-02 DIAGNOSIS — Z1231 Encounter for screening mammogram for malignant neoplasm of breast: Secondary | ICD-10-CM

## 2018-01-27 ENCOUNTER — Ambulatory Visit
Admission: RE | Admit: 2018-01-27 | Discharge: 2018-01-27 | Disposition: A | Discharge status: Home / Self Care | Payer: BLUE CROSS/BLUE SHIELD | Source: Ambulatory Visit | Attending: Obstetrics & Gynecology | Admitting: Obstetrics & Gynecology

## 2018-01-27 DIAGNOSIS — Z1231 Encounter for screening mammogram for malignant neoplasm of breast: Secondary | ICD-10-CM

## 2018-03-08 ENCOUNTER — Other Ambulatory Visit: Payer: Self-pay | Admitting: Obstetrics & Gynecology

## 2018-03-10 ENCOUNTER — Ambulatory Visit: Payer: BLUE CROSS/BLUE SHIELD | Admitting: Obstetrics & Gynecology

## 2018-03-10 ENCOUNTER — Telehealth: Payer: Self-pay | Admitting: Obstetrics & Gynecology

## 2018-03-10 NOTE — Telephone Encounter (Signed)
Left message to call Jamauri Kruzel, RN at [facility] [phone].   

## 2018-03-10 NOTE — Telephone Encounter (Signed)
Patient called to reschedule aex that was cancelled this afternoon with Dr. Sabra Heck. Rescheduled to 06/12/2018. Patient would like to discuss a few things with a nurse and possibly schedule something sooner with another provider.

## 2018-03-10 NOTE — Progress Notes (Deleted)
53 y.o. G52P2002 Married White or Caucasian female here for annual exam.    No LMP recorded. (Menstrual status: Irregular Periods).          Sexually active: {yes no:314532}  The current method of family planning is {contraception:315051}.    Exercising: {yes no:314532}  {types:19826} Smoker:  {YES [id number]  Health Maintenance: Pap:  09/19/15 Neg. HR HPV:neg   08/27/13 Neg  History of abnormal Pap:  no MMG:  01/27/18 BIRADS1:Neg  Colonoscopy:  07/03/15 polyps. F/u 5 years  BMD:   Never  TDaP:  2013 Pneumonia vaccine(s):  *** Shingrix:   *** Hep C testing: *** Screening Labs: ***   reports that she has never smoked. She has never used smokeless tobacco. She reports that she does not drink alcohol or use drugs.  Past Medical History:  Diagnosis Date  . Esophagitis 06/2015   after 06/2015 endoscopy   . Migraines    no aura  . Syncopal episodes    situational    No past surgical history on file.  Current Outpatient Medications  Medication Sig Dispense Refill  . Aspirin-Acetaminophen-Caffeine (EXCEDRIN PO) Take by mouth.    Marland Kitchen ibuprofen (ADVIL,MOTRIN) 200 MG tablet Take 200 mg by mouth every 6 (six) hours as needed.    . medroxyPROGESTERone (PROVERA) 10 MG tablet Take 1 tablet (10 mg total) daily by mouth. 10 tablet 0  . omeprazole (PRILOSEC) 40 MG capsule 1 CAP(S) ONCE A DAY 20 MINUTES BEFORE BREAKFAST ORALLY 30 DAY(S)  12  . Vitamin D, Ergocalciferol, (DRISDOL) 50000 units CAPS capsule Take 1 capsule (50,000 Units total) every 7 (seven) days by mouth. 12 capsule 4   No current facility-administered medications for this visit.     Family History  Problem Relation Age of Onset  . Hypertension Mother   . Depression Mother   . Hypertension Father   . Depression Father   . Depression Brother   . Hypertension Maternal Grandmother   . Breast cancer Neg Hx     Review of Systems  Exam:   There were no vitals taken for this visit.  Height:      Ht Readings from Last 3  Encounters:  12/24/16 5\' 5"  (1.651 m)  11/02/15 5' 5.25" (1.657 m)  09/19/15 5' 5.25" (1.657 m)    General appearance: alert, cooperative and appears stated age Head: Normocephalic, without obvious abnormality, atraumatic Neck: no adenopathy, supple, symmetrical, trachea midline and thyroid {EXAM; THYROID:18604} Lungs: clear to auscultation bilaterally Breasts: {Exam; breast:13139::"normal appearance, no masses or tenderness"} Heart: regular rate and rhythm Abdomen: soft, non-tender; bowel sounds normal; no masses,  no organomegaly Extremities: extremities normal, atraumatic, no cyanosis or edema Skin: Skin color, texture, turgor normal. No rashes or lesions Lymph nodes: Cervical, supraclavicular, and axillary nodes normal. No abnormal inguinal nodes palpated Neurologic: Grossly normal   Pelvic: External genitalia:  no lesions              Urethra:  normal appearing urethra with no masses, tenderness or lesions              Bartholins and Skenes: normal                 Vagina: normal appearing vagina with normal color and discharge, no lesions              Cervix: {exam; cervix:14595}              Pap taken: {yes no:314532} Bimanual Exam:  Uterus:  {exam; uterus:12215}  Adnexa: {exam; adnexa:12223}               Rectovaginal: Confirms               Anus:  normal sphincter tone, no lesions  Chaperone was present for exam.  A:  Well Woman with normal exam  P:   {plan; gyn:5269::"mammogram","pap smear","return annually or prn"}

## 2018-03-13 NOTE — Telephone Encounter (Signed)
Spoke with patient. Last AEX 1113/18, next AEX 06/12/18. Patient reports vasomotor symptoms, spotting in June 2019 and again Jan 2020. Discussed Provera at last AEX, has RX, has not taken provera. Would like to discuss symptoms with provider before next AEX. Denies pain or any other GYN symptoms.  Recommended OV, scheduled for 2/6 at 2:45pm. Patient declined earlier OV.   Advised Dr. Sabra Heck will review, our office will return call if any additional recommendations.  Routing to provider for final review. Patient is agreeable to disposition. Will close encounter.

## 2018-03-19 ENCOUNTER — Ambulatory Visit: Payer: Self-pay | Admitting: Obstetrics & Gynecology

## 2018-03-19 NOTE — Progress Notes (Deleted)
53 y.o. G66P2002 Married White or Caucasian female here for annual exam.    No LMP recorded. (Menstrual status: Irregular Periods).          Sexually active: {yes no:314532}  The current method of family planning is {contraception:315051}.    Exercising: {yes no:314532}  {types:19826} Smoker:  {YES [id number]  Health Maintenance: Pap:  09/19/15 Neg. HR HPV:neg   08/27/13 Neg   History of abnormal Pap:  no MMG:  01/27/18 BIRADS1:neg  Colonoscopy:  2017 polyps. F/u 5 years  BMD:   *** TDaP:  2013 Pneumonia vaccine(s):  *** Shingrix:   *** Hep C testing: *** Screening Labs: ***   reports that she has never smoked. She has never used smokeless tobacco. She reports that she does not drink alcohol or use drugs.  Past Medical History:  Diagnosis Date  . Esophagitis 06/2015   after 06/2015 endoscopy   . Migraines    no aura  . Syncopal episodes    situational    No past surgical history on file.  Current Outpatient Medications  Medication Sig Dispense Refill  . Aspirin-Acetaminophen-Caffeine (EXCEDRIN PO) Take by mouth.    Marland Kitchen ibuprofen (ADVIL,MOTRIN) 200 MG tablet Take 200 mg by mouth every 6 (six) hours as needed.    . medroxyPROGESTERone (PROVERA) 10 MG tablet Take 1 tablet (10 mg total) daily by mouth. 10 tablet 0  . omeprazole (PRILOSEC) 40 MG capsule 1 CAP(S) ONCE A DAY 20 MINUTES BEFORE BREAKFAST ORALLY 30 DAY(S)  12  . Vitamin D, Ergocalciferol, (DRISDOL) 50000 units CAPS capsule Take 1 capsule (50,000 Units total) every 7 (seven) days by mouth. 12 capsule 4   No current facility-administered medications for this visit.     Family History  Problem Relation Age of Onset  . Hypertension Mother   . Depression Mother   . Hypertension Father   . Depression Father   . Depression Brother   . Hypertension Maternal Grandmother   . Breast cancer Neg Hx     Review of Systems  Exam:   There were no vitals taken for this visit.  Height:      Ht Readings from Last 3 Encounters:   12/24/16 5\' 5"  (1.651 m)  11/02/15 5' 5.25" (1.657 m)  09/19/15 5' 5.25" (1.657 m)    General appearance: alert, cooperative and appears stated age Head: Normocephalic, without obvious abnormality, atraumatic Neck: no adenopathy, supple, symmetrical, trachea midline and thyroid {EXAM; THYROID:18604} Lungs: clear to auscultation bilaterally Breasts: {Exam; breast:13139::"normal appearance, no masses or tenderness"} Heart: regular rate and rhythm Abdomen: soft, non-tender; bowel sounds normal; no masses,  no organomegaly Extremities: extremities normal, atraumatic, no cyanosis or edema Skin: Skin color, texture, turgor normal. No rashes or lesions Lymph nodes: Cervical, supraclavicular, and axillary nodes normal. No abnormal inguinal nodes palpated Neurologic: Grossly normal   Pelvic: External genitalia:  no lesions              Urethra:  normal appearing urethra with no masses, tenderness or lesions              Bartholins and Skenes: normal                 Vagina: normal appearing vagina with normal color and discharge, no lesions              Cervix: {exam; cervix:14595}              Pap taken: {yes no:314532} Bimanual Exam:  Uterus:  {exam; uterus:12215}  Adnexa: {exam; adnexa:12223}               Rectovaginal: Confirms               Anus:  normal sphincter tone, no lesions  Chaperone was present for exam.  A:  Well Woman with normal exam  P:   {plan; gyn:5269::"mammogram","pap smear","return annually or prn"}

## 2018-03-23 ENCOUNTER — Ambulatory Visit (INDEPENDENT_AMBULATORY_CARE_PROVIDER_SITE_OTHER): Payer: BLUE CROSS/BLUE SHIELD | Admitting: Obstetrics & Gynecology

## 2018-03-23 ENCOUNTER — Encounter: Payer: Self-pay | Admitting: Obstetrics & Gynecology

## 2018-03-23 ENCOUNTER — Other Ambulatory Visit: Payer: Self-pay

## 2018-03-23 ENCOUNTER — Other Ambulatory Visit (HOSPITAL_COMMUNITY)
Admission: RE | Admit: 2018-03-23 | Discharge: 2018-03-23 | Disposition: A | Discharge status: Home / Self Care | Payer: BLUE CROSS/BLUE SHIELD | Source: Ambulatory Visit | Attending: Obstetrics & Gynecology | Admitting: Obstetrics & Gynecology

## 2018-03-23 VITALS — BP 110/80 | HR 84 | Resp 16 | Ht 65.0 in | Wt 227.2 lb

## 2018-03-23 DIAGNOSIS — Z124 Encounter for screening for malignant neoplasm of cervix: Secondary | ICD-10-CM | POA: Insufficient documentation

## 2018-03-23 DIAGNOSIS — E559 Vitamin D deficiency, unspecified: Secondary | ICD-10-CM | POA: Diagnosis not present

## 2018-03-23 DIAGNOSIS — Z01419 Encounter for gynecological examination (general) (routine) without abnormal findings: Secondary | ICD-10-CM | POA: Diagnosis not present

## 2018-03-23 DIAGNOSIS — N912 Amenorrhea, unspecified: Secondary | ICD-10-CM | POA: Diagnosis not present

## 2018-03-23 MED ORDER — VITAMIN D (ERGOCALCIFEROL) 1.25 MG (50000 UNIT) PO CAPS: 50000.0000 [IU] | ORAL_CAPSULE | ORAL | 4 refills | Status: DC

## 2018-03-23 NOTE — Patient Instructions (Addendum)
Black cohosh Estroven  Gabapentin/neurontin Paxil  Outpatient Pharmacy at [facility] [place], [place] [address]  Main: [phone]

## 2018-03-23 NOTE — Progress Notes (Signed)
53 y.o. G73P2002 Married White or Caucasian female here for annual exam.  Had a very light cycle in June.  This was first cycle in about six months.  She then had a very light dark spotting in January.  This lasted two days.  Having increased night sweats.  Doesn't think she has much issues with hot flashes.  She is having some sleep disturbance with the hot flashes. Does not really want to start HRT at this time.    Patient's last menstrual period was 02/20/2018 (approximate).          Sexually active: Yes.    The current method of family planning is vasectomy.    Exercising: Yes.    walking Smoker:  no  Health Maintenance: Pap:  09/19/15 Neg. HR HPV:neg   08/27/13 neg  History of abnormal Pap:  no MMG:  01/27/18 BIRADS1:neg  Colonoscopy:  06/2015 polyps. F/u 5 years  BMD:   never TDaP:  2013 Pneumonia vaccine(s):  n/a Shingrix:   No Hep C testing: n/a Screening Labs: lipids 8/17, TSH and Vit D 10/18    reports that she has never smoked. She has never used smokeless tobacco. She reports current alcohol use of about 1.0 standard drinks of alcohol per week. She reports that she does not use drugs.  Past Medical History:  Diagnosis Date  . Esophagitis 06/2015   after 06/2015 endoscopy   . Migraines    no aura  . Syncopal episodes    situational    History reviewed. No pertinent surgical history.  Current Outpatient Medications  Medication Sig Dispense Refill  . Aspirin-Acetaminophen-Caffeine (EXCEDRIN PO) Take by mouth.    Marland Kitchen ibuprofen (ADVIL,MOTRIN) 200 MG tablet Take 200 mg by mouth every 6 (six) hours as needed.    . Multiple Vitamin (MULTIVITAMIN) tablet Take 1 tablet by mouth daily.    Marland Kitchen omeprazole (PRILOSEC) 40 MG capsule 1 CAP(S) ONCE A DAY 20 MINUTES BEFORE BREAKFAST ORALLY 30 DAY(S)  12  . Vitamin D, Ergocalciferol, (DRISDOL) 50000 units CAPS capsule Take 1 capsule (50,000 Units total) every 7 (seven) days by mouth. 12 capsule 4  . medroxyPROGESTERone (PROVERA) 10 MG tablet  Take 1 tablet (10 mg total) daily by mouth. (Patient not taking: Reported on 03/23/2018) 10 tablet 0   No current facility-administered medications for this visit.     Family History  Problem Relation Age of Onset  . Hypertension Mother   . Depression Mother   . Hypertension Father   . Depression Father   . Depression Brother   . Hypertension Maternal Grandmother   . Breast cancer Neg Hx     Review of Systems  All other systems reviewed and are negative.   Exam:   BP 110/80 (BP Location: Right Arm, Patient Position: Sitting, Cuff Size: Large)   Pulse 84   Resp 16   Ht 5\' 5"  (1.651 m)   Wt 227 lb 3.2 oz (103.1 kg)   LMP 02/20/2018 (Approximate)   BMI 37.81 kg/m   Height:   Height: 5\' 5"  (165.1 cm)  Ht Readings from Last 3 Encounters:  03/23/18 5\' 5"  (1.651 m)  12/24/16 5\' 5"  (1.651 m)  11/02/15 5' 5.25" (1.657 m)    General appearance: alert, cooperative and appears stated age Head: Normocephalic, without obvious abnormality, atraumatic Neck: no adenopathy, supple, symmetrical, trachea midline and thyroid normal to inspection and palpation Lungs: clear to auscultation bilaterally Breasts: normal appearance, no masses or tenderness Heart: regular rate and rhythm Abdomen: soft,  non-tender; bowel sounds normal; no masses,  no organomegaly Extremities: extremities normal, atraumatic, no cyanosis or edema Skin: Skin color, texture, turgor normal. No rashes or lesions Lymph nodes: Cervical, supraclavicular, and axillary nodes normal. No abnormal inguinal nodes palpated Neurologic: Grossly normal   Pelvic: External genitalia:  no lesions              Urethra:  normal appearing urethra with no masses, tenderness or lesions              Bartholins and Skenes: normal                 Vagina: normal appearing vagina with normal color and discharge, no lesions              Cervix: no lesions              Pap taken: Yes.   Bimanual Exam:  Uterus:  normal size, contour,  position, consistency, mobility, non-tender              Adnexa: normal adnexa and no mass, fullness, tenderness               Rectovaginal: Confirms               Anus:  normal sphincter tone, no lesions  Chaperone was present for exam.  A:  Well Woman with normal exam Amenorrhea Vit D deficiency H/O elevated calcium that was evaluated with Dr. Cruzita Lederer  P:   Mammogram guidelines reviewed.  Doing 3D. Pap smear obtained.  Neg and neg HR HPV 2018. Vit D ordered.  RF for Vit D 50k weekly to pharmacy.  #12/4RF FSH and estradiol level obtained today Information about alternative therapies for hot flashes/night sweats discussed. Colonoscopy UTD Shingrix information given. return annually or prn

## 2018-03-24 LAB — CYTOLOGY - PAP: Diagnosis: NEGATIVE

## 2018-03-24 LAB — ESTRADIOL: Estradiol: 12.8 pg/mL

## 2018-03-24 LAB — VITAMIN D 25 HYDROXY (VIT D DEFICIENCY, FRACTURES): Vit D, 25-Hydroxy: 48.9 ng/mL (ref 30.0–100.0)

## 2018-03-24 LAB — FOLLICLE STIMULATING HORMONE: FSH: 46.2 m[IU]/mL

## 2018-06-12 ENCOUNTER — Encounter

## 2018-06-12 ENCOUNTER — Ambulatory Visit: Payer: BLUE CROSS/BLUE SHIELD | Admitting: Obstetrics & Gynecology

## 2019-01-12 DIAGNOSIS — N8502 Endometrial intraepithelial neoplasia [EIN]: Secondary | ICD-10-CM

## 2019-01-12 HISTORY — DX: Endometrial intraepithelial neoplasia (EIN): N85.02

## 2019-02-02 ENCOUNTER — Telehealth: Payer: Self-pay | Admitting: Obstetrics & Gynecology

## 2019-02-02 ENCOUNTER — Other Ambulatory Visit: Payer: Self-pay

## 2019-02-02 ENCOUNTER — Other Ambulatory Visit (HOSPITAL_COMMUNITY)
Admission: RE | Admit: 2019-02-02 | Discharge: 2019-02-02 | Disposition: A | Discharge status: Home / Self Care | Payer: BC Managed Care – PPO | Source: Ambulatory Visit | Attending: Obstetrics & Gynecology | Admitting: Obstetrics & Gynecology

## 2019-02-02 ENCOUNTER — Encounter: Payer: Self-pay | Admitting: Obstetrics & Gynecology

## 2019-02-02 ENCOUNTER — Ambulatory Visit: Payer: BC Managed Care – PPO | Admitting: Obstetrics & Gynecology

## 2019-02-02 VITALS — BP 136/72 | HR 76 | Temp 97.7°F | Ht 65.0 in | Wt 229.0 lb

## 2019-02-02 DIAGNOSIS — N950 Postmenopausal bleeding: Secondary | ICD-10-CM | POA: Insufficient documentation

## 2019-02-02 DIAGNOSIS — R102 Pelvic and perineal pain: Secondary | ICD-10-CM | POA: Diagnosis not present

## 2019-02-02 DIAGNOSIS — N8502 Endometrial intraepithelial neoplasia [EIN]: Secondary | ICD-10-CM | POA: Diagnosis not present

## 2019-02-02 MED ORDER — SULFAMETHOXAZOLE-TRIMETHOPRIM 800-160 MG PO TABS: 1.0000 | ORAL_TABLET | Freq: Two times a day (BID) | ORAL | 0 refills | Status: DC

## 2019-02-02 NOTE — Progress Notes (Signed)
GYNECOLOGY  VISIT  CC:   Patient states that she noticed a "pink tinge" when she wiped. Has used a tampon, noticed blood on the tampon. Has had some frequent urination and urinary incontinence.  HPI: 53 y.o. G85P2002 Married White or Caucasian female here for PMB that she noticed today.  It is light and pink and she has noticed some red bleeding on tampons.  She is not having any cramping.  She is having a little bladder pressure.  Denies dysuria.  Denies back pain.  This has just been present today.  GYNECOLOGIC HISTORY: Patient's last menstrual period was 02/20/2018 (approximate). Contraception: Postmenopausal Menopausal hormone therapy: none -- patient takes OTC Estroven.  Patient Active Problem List   Diagnosis Date Noted  . Vitamin D deficiency 11/28/2016  . Fibroids, intramural 02/26/2015  . High serum estradiol 09/29/2014    Past Medical History:  Diagnosis Date  . Esophagitis 06/2015   after 06/2015 endoscopy   . Migraines    no aura  . Syncopal episodes    situational    History reviewed. No pertinent surgical history.  MEDS:   Current Outpatient Medications on File Prior to Visit  Medication Sig Dispense Refill  . Aspirin-Acetaminophen-Caffeine (EXCEDRIN PO) Take by mouth.    Marland Kitchen ibuprofen (ADVIL,MOTRIN) 200 MG tablet Take 200 mg by mouth every 6 (six) hours as needed.    . Multiple Vitamin (MULTIVITAMIN) tablet Take 1 tablet by mouth daily.    Marland Kitchen omeprazole (PRILOSEC) 40 MG capsule 1 CAP(S) ONCE A DAY 20 MINUTES BEFORE BREAKFAST ORALLY 30 DAY(S)  12  . Rhubarb (ESTROVEN MENOPAUSE RELIEF PO) Take by mouth.    . Vitamin D, Ergocalciferol, (DRISDOL) 1.25 MG (50000 UT) CAPS capsule Take 1 capsule (50,000 Units total) by mouth every 7 (seven) days. 12 capsule 4   No current facility-administered medications on file prior to visit.    ALLERGIES: Penicillins  Family History  Problem Relation Age of Onset  . Hypertension Mother   . Depression Mother   . Hypertension  Father   . Depression Father   . Depression Brother   . Hypertension Maternal Grandmother   . Breast cancer Neg Hx     SH:  Married, non smoker  Review of Systems  Genitourinary: Positive for frequency and vaginal bleeding.       Urinary incontinence with cough of sneeze  All other systems reviewed and are negative.   PHYSICAL EXAMINATION:    BP 136/72 (BP Location: Right Arm, Patient Position: Sitting, Cuff Size: Large)   Pulse 76   Temp 97.7 F (36.5 C) (Temporal)   Ht 5\' 5"  (1.651 m)   Wt 229 lb (103.9 kg)   LMP 02/20/2018 (Approximate)   BMI 38.11 kg/m     General appearance: alert, cooperative and appears stated age Abdomen: soft, non-tender; bowel sounds normal; no masses,  no organomegaly Lymph:  no inguinal LAD noted  Pelvic: External genitalia:  no lesions              Urethra:  normal appearing urethra with no masses, tenderness or lesions              Bartholins and Skenes: normal                 Vagina: normal appearing vagina with normal color and discharge, no lesions              Cervix: no lesions  Bimanual Exam:  Uterus:  normal size, contour, position, consistency, mobility, non-tender              Adnexa: no mass, fullness, tenderness  Endometrial biopsy recommended.  Discussed with patient.  Verbal and written consent obtained.   Procedure:  Speculum placed.  Cervix visualized and cleansed with betadine prep.  A single toothed tenaculum was applied to the anterior lip of the cervix.  Endometrial pipelle was advanced through the cervix into the endometrial cavity without difficulty.  Pipelle passed to 8cm.  Suction applied and pipelle removed with good tissue sample obtained.  Tenculum removed.  No bleeding noted.  Patient tolerated procedure well.  Chaperone, Terence Lux, CMA, was present for exam.  Assessment: PMP bleeding Bladder pressure/pelvic pressure  Plan: Urine culture pending.  Rx for Bactrim DS BID x 3 days. Endometrial  biopsy pending FSH obtained today   ~15 minutes spent with patient >50% of time was in face to face discussion of above.

## 2019-02-02 NOTE — Telephone Encounter (Signed)
Spoke to pt. Pt states having PMB that started this morning.  Placed tampon and had light bleeding on it. Hasn't had cycle or bleeding in 1 year and only had light spotting in Jan 2020. Pt had some urgency last couple days. Denies pain and burning.Pt would like OV before holidays. OV scheduled 02/02/19 at 4pm. Pt agreeable. CPS negative.  Will route to Dr Sabra Heck for review and will close encounter.

## 2019-02-02 NOTE — Telephone Encounter (Signed)
Left message for pt to call to triage Colletta Maryland, RN

## 2019-02-02 NOTE — Telephone Encounter (Signed)
Patient is calling regarding post menopausal bleeding. Patient stated that it has been over a year since she has had any vaginal bleeding.

## 2019-02-03 LAB — FOLLICLE STIMULATING HORMONE: FSH: 25.8 m[IU]/mL

## 2019-02-04 LAB — URINE CULTURE

## 2019-02-08 LAB — SURGICAL PATHOLOGY

## 2019-02-10 ENCOUNTER — Telehealth: Payer: Self-pay | Admitting: *Deleted

## 2019-02-10 NOTE — Telephone Encounter (Signed)
Patient calling for referral to gyn oncology. Was told to speak with Gay Filler about referral.

## 2019-02-10 NOTE — Telephone Encounter (Signed)
ll back to patient. Awaiting call back from [place].  Patient states they have just called her and she is now scheduled with Dr Berline Lopes on 02-15-19.  Routing to provider. Encounter closed.

## 2019-02-10 NOTE — Telephone Encounter (Signed)
Spoke with the patient, gave the appt time/date for 1/4. New patient appt with Dr Berline Lopes. Gave the patient the information for masks, visitors and parking. Also explained that she may have a pelvic exam

## 2019-02-14 NOTE — Progress Notes (Signed)
GYNECOLOGIC ONCOLOGY NEW PATIENT CONSULTATION   Patient Name: Donna Shepard  Patient Age: 54 y.o. Date of Service: 02/14/18 Referring Provider: No referring provider defined for this encounter.   Primary Care Provider: Patient, No Pcp Per Consulting Provider: Jeral Pinch, MD   Assessment/Plan:  54 year old with complex atypical hyperplasia.  We reviewed the diagnosis of complex atypical hyperplasia (CAH) and the treatment options, including medical management (Mirena IUD or progesterone PO) or hysterectomy.  Given that she is a suitable candidate for surgery and that she has completed childbearing, I recommend leaving forward with definitive surgery.  The patient is a suitable candidate for hysterectomy via a minimally invasive approach to surgery.  Given that she is postmenopausal, a bilateral salpingo-oophorectomy is also recommended.  We reviewed that robotic assistance would be used to complete the surgery.  We discussed that endometrial cancer is detected in about 40% of final uterine pathology specimens from patients with CAH.  We discussed that standard of care would be to send the uterus for intraoperative frozen pathology help dictate the need for additional procedures including lymph node evaluation if a cancer is identified.  We also discussed the possibility of a sentinel lymph node biopsy at the start of the procedure to obviate the need for a full lymph node dissection if cancer is identified on frozen section.  We discussed the risks of both the sentinel lymph node biopsy as well as full lymph node dissection and the patient feels comfortable proceeding with sentinel lymph node biopsy.    We reviewed the sentinel lymph node technique. Risks and benefits of sentinel lymph node biopsy was reviewed. We reviewed the technique and ICG dye. The patient DOES NOT have an iodine allergy or known liver dysfunction. We reviewed the false negative rate (0.4%), and that 3% of patients with  metastatic disease will not have it detected by SLN biopsy in endometrial cancer. A low risk of allergic reaction to the dye, <0.2% for ICG, has been reported. We also discussed that in the case of failed mapping, which occurs 40% of the time, a bilateral or unilateral lymphadenectomy will be performed at the surgeon's discretion.   Potential benefits of sentinel nodes including a higher detection rate for metastasis due to ultrastaging and potential reduction in operative morbidity. However, there remains uncertainty as to the role for treatment of micrometastatic disease. Further, the benefit of operative morbidity associated with the SLN technique in endometrial cancer is not yet completely known. In other patient populations (e.g. the cervical cancer population) there has been observed reductions in morbidity with SLN biopsy compared to pelvic lymphadenectomy. Lymphedema, nerve dysfunction and lymphocysts are all potential risks with the SLN technique as with complete lymphadenectomy. Additional risks to the patient include the risk of damage to an internal organ while operating in an altered view (e.g. the black and white image of the robotic fluorescence imaging mode).   The patient was consented for a robotic assisted hysterectomy, bilateral salpingo-oophorectomy, sentinel lymph node evaluation, possible lymph node dissection, possible laparotomy.  If there is failure to map, given a diagnosis of CAH, I will plan to send the uterus for frozen section during surgery.  The risks of surgery were discussed in detail and she understands these to include infection; wound separation; hernia; vaginal cuff separation, injury to adjacent organs such as bowel, bladder, blood vessels, ureters and nerves; bleeding which may require blood transfusion; anesthesia risk; thromboembolic events; possible death; unforeseen complications; possible need for re-exploration; medical complications such as heart attack,  stroke,  pleural effusion and pneumonia; and, if full lymphadenectomy is performed the risk of lymphedema and lymphocyst. The patient will receive DVT and antibiotic prophylaxis as indicated. She voiced a clear understanding. She had the opportunity to ask questions. Perioperative instructions were reviewed with her. Prescriptions for post-op medications were sent to her pharmacy of choice.  We also reviewed the importance of weight loss in overall health as well as reduction in cancer risk.   53 minutes of total time was spent for this patient encounter, including preparation, face-to-face counseling with the patient and coordination of care, and documentation of the encounter.  A copy of this note was sent to the patient's referring provider.   Jeral Pinch, MD  Division of Gynecologic Oncology  Department of Obstetrics and Gynecology  [facility]  ___________________________________________  Chief Complaint: Chief Complaint  Patient presents with  . Complex atypical endometrial hyperplasia    History of Present Illness:  Donna Shepard is a 54 y.o. y.o. female who is seen in consultation at the request of No ref. provider found for an evaluation of PMB.  Based on cessation of menses as well as hormone levels, the patient was thought to be in menopause as of last January or February.  She was also having night sweats at the time and started taking Estroven over-the-counter which helped with the symptoms.  She has seen Dr. Sabra Heck on and off for abnormal bleeding in the past.  The week before Christmas, she had some light bleeding that she thought was related to a urinary tract infection.  She placed a tampon and noted that it was vaginal bleeding.  She called to be seen and on 12/22, underwent endometrial biopsy with final pathology revealing complex atypical hyperplasia.  She has had no bleeding since the biopsy.  She notes some fullness in her lower quadrants and has  a spot where she feels some discomfort intermittently in her right lower pelvis.  She endorses having a good appetite without nausea or vomiting.  She reports normal bowel and bladder function.  Her husband is scheduled for a knee replacement on Monday.  She denies any shortness of breath or chest pain with ambulation or at rest.  PAST MEDICAL HISTORY:  Past Medical History:  Diagnosis Date  . Esophagitis 06/2015   after 06/2015 endoscopy   . Migraines    no aura  . Syncopal episodes    situational     PAST SURGICAL HISTORY:  History reviewed. No pertinent surgical history.  OB/GYN HISTORY:  OB History  Gravida Para Term Preterm AB Living  2 2 2     2   SAB TAB Ectopic Multiple Live Births          2    # Outcome Date GA Lbr Len/2nd Weight Sex Delivery Anes PTL Lv  2 Term     F Vag-Spont   LIV  1 Term     F Vag-Spont   LIV    Patient's last menstrual period was 02/20/2018 (approximate).  Age at menarche: 51  Age at menopause: 15 Hx of HRT: denies, uses OTC Estroven Hx of STDs: no Last pap: 03/2018, negative History of abnormal pap smears: no  SCREENING STUDIES:  Last mammogram: 01/2018  Last colonoscopy: 2017, due in 5 years Last bone mineral density: n/a  MEDICATIONS: Outpatient Encounter Medications as of 02/15/2019  Medication Sig  . Rhubarb (ESTROVEN MENOPAUSE RELIEF PO) Take by mouth.  . Vitamin D, Ergocalciferol, (DRISDOL) 1.25 MG (  50000 UT) CAPS capsule Take 1 capsule (50,000 Units total) by mouth every 7 (seven) days.  . Aspirin-Acetaminophen-Caffeine (EXCEDRIN PO) Take by mouth.  Marland Kitchen ibuprofen (ADVIL) 800 MG tablet Take 1 tablet (800 mg total) by mouth every 8 (eight) hours as needed for moderate pain. For AFTER surgery  . Multiple Vitamin (MULTIVITAMIN) tablet Take 1 tablet by mouth daily.  Marland Kitchen omeprazole (PRILOSEC) 40 MG capsule 1 CAP(S) ONCE A DAY 20 MINUTES BEFORE BREAKFAST ORALLY 30 DAY(S)  . oxyCODONE (OXY IR/ROXICODONE) 5 MG immediate release tablet Take 1  tablet (5 mg total) by mouth every 4 (four) hours as needed for severe pain. For AFTER surgery only, do not take and drive  . senna-docusate (SENOKOT-S) 8.6-50 MG tablet Take 2 tablets by mouth at bedtime. For AFTER surgery, do not take if having diarrhea  . [DISCONTINUED] ibuprofen (ADVIL,MOTRIN) 200 MG tablet Take 200 mg by mouth every 6 (six) hours as needed.  . [DISCONTINUED] sulfamethoxazole-trimethoprim (BACTRIM DS) 800-160 MG tablet Take 1 tablet by mouth 2 (two) times daily.   No facility-administered encounter medications on file as of 02/15/2019.    ALLERGIES:  Allergies  Allergen Reactions  . Penicillins Rash     FAMILY HISTORY:  Family History  Problem Relation Age of Onset  . Hypertension Mother   . Depression Mother   . Hypertension Father   . Depression Father   . Depression Brother   . Hypertension Maternal Grandmother   . Breast cancer Neg Hx   . Ovarian cancer Neg Hx   . Endometrial cancer Neg Hx      SOCIAL HISTORY:    Social Connections:   . Frequency of Communication with Friends and Family: Not on file  . Frequency of Social Gatherings with Friends and Family: Not on file  . Attends Religious Services: Not on file  . Active Member of Clubs or Organizations: Not on file  . Attends Archivist Meetings: Not on file  . Marital Status: Not on file    REVIEW OF SYSTEMS:  Pertinent positives as per HPI Denies appetite changes, fevers, chills, fatigue, unexplained weight changes. Denies hearing loss, neck lumps or masses, mouth sores, ringing in ears or voice changes. Denies cough or wheezing.  Denies shortness of breath. Denies chest pain or palpitations. Denies leg swelling. Denies abdominal distention, pain, blood in stools, constipation, diarrhea, nausea, vomiting, or early satiety. Denies pain with intercourse, dysuria, frequency, hematuria or incontinence. Denies hot flashes, pelvic pain, or vaginal discharge.   Denies joint pain, back pain  or muscle pain/cramps. Denies itching, rash, or wounds. Denies dizziness, headaches, numbness or seizures. Denies swollen lymph nodes or glands, denies easy bruising or bleeding. Denies anxiety, depression, confusion, or decreased concentration.  Physical Exam:  Vital Signs for this encounter:  Blood pressure 135/82, pulse 66, temperature 97.9 F (36.6 C), temperature source Temporal, resp. rate 16, height 5\' 5"  (1.651 m), weight 225 lb (102.1 kg), last menstrual period 02/20/2018, SpO2 100 %. Body mass index is 37.44 kg/m. General: Alert, oriented, no acute distress.  HEENT: Normocephalic, atraumatic. Sclera anicteric.  Chest: Clear to auscultation bilaterally.  Cardiovascular: Regular rate and rhythm, no murmurs, rubs, or gallops.  Abdomen: Obese. Normoactive bowel sounds. Soft, nondistended, nontender to palpation. No masses or hepatosplenomegaly appreciated. No palpable fluid wave.  Extremities: Grossly normal range of motion. Warm, well perfused. No edema bilaterally.  Skin: No rashes or lesions.  Lymphatics: No cervical, supraclavicular, or inguinal adenopathy.  GU:  Normal external female genitalia.  No lesions. No discharge or bleeding.             Bladder/urethra:  No lesions or masses, well supported bladder             Vagina: Mild atrophy, no lesions or masses.             Cervix: Normal appearing, no lesions.             Uterus: Small, mobile, no parametrial involvement or nodularity.             Adnexa: No masses.  Rectal: no nodularity  LABORATORY AND RADIOLOGIC DATA:  Outside medical records were reviewed to synthesize the above history, along with the history and physical obtained during the visit.   EMB on 12/22: CAH

## 2019-02-15 ENCOUNTER — Inpatient Hospital Stay: Payer: BC Managed Care – PPO | Attending: Gynecologic Oncology | Admitting: Gynecologic Oncology

## 2019-02-15 ENCOUNTER — Other Ambulatory Visit: Payer: Self-pay

## 2019-02-15 ENCOUNTER — Encounter: Payer: Self-pay | Admitting: Gynecologic Oncology

## 2019-02-15 VITALS — BP 135/82 | HR 66 | Temp 97.9°F | Resp 16 | Ht 65.0 in | Wt 225.0 lb

## 2019-02-15 DIAGNOSIS — N8502 Endometrial intraepithelial neoplasia [EIN]: Secondary | ICD-10-CM

## 2019-02-15 MED ORDER — OXYCODONE HCL 5 MG PO TABS: 5.0000 mg | ORAL_TABLET | ORAL | 0 refills | Status: DC | PRN

## 2019-02-15 MED ORDER — SENNOSIDES-DOCUSATE SODIUM 8.6-50 MG PO TABS: 2.0000 | ORAL_TABLET | Freq: Every day | ORAL | 1 refills | Status: DC

## 2019-02-15 MED ORDER — IBUPROFEN 800 MG PO TABS: 800.0000 mg | ORAL_TABLET | Freq: Three times a day (TID) | ORAL | 1 refills | Status: DC | PRN

## 2019-02-15 NOTE — Patient Instructions (Signed)
Preparing for your Surgery  Plan for surgery on March 31, 2019 with Dr. Jeral Pinch at [facility]. You will be scheduled for a robotic assisted total laparoscopic hysterectomy, bilateral salpingo-oophorectomy, sentinel lymph node biopsy, possible lymph node dissection, possible laparotomy (open incision).   Pre-operative Testing -You will receive a phone call from presurgical testing at [facility] if you have not received a call already to arrange for a pre-operative testing appointment before your surgery.  This appointment normally occurs one to two weeks before your scheduled surgery.   -Bring your insurance card, copy of an advanced directive if applicable, medication list  -At that visit, you will be asked to sign a consent for a possible blood transfusion in case a transfusion becomes necessary during surgery.  The need for a blood transfusion is rare but having consent is a necessary part of your care.     -You should not be taking blood thinners or aspirin at least ten days prior to surgery unless instructed by your surgeon.  -DO NOT USE IBUPROFEN OR EXCEDRIN PRODUCTS ONE WEEK BEFORE SURGERY.  -As part of our enhanced surgical recovery pathway, you may be advised to drink a carbohydrate drink the morning of surgery (at least 3 hours before). If you are diabetic, this will be substituted with G2 gatorade in order to prevent elevated glucose levels prior to surgery.  -Do not take supplements such as fish oil (omega 3), red yeast rice, tumeric before your surgery.  Day Before Surgery at [place] will be asked to take in a light diet the day before surgery.  Avoid carbonated beverages.  You will be advised to have nothing to eat or drink after midnight the evening before.    Eat a light diet the day before surgery.  Examples including soups, broths, toast, yogurt, mashed potatoes.  Things to avoid include carbonated beverages (fizzy beverages),  raw fruits and raw vegetables, or beans.   If your bowels are filled with gas, your surgeon will have difficulty visualizing your pelvic organs which increases your surgical risks.  Your role in recovery Your role is to become active as soon as directed by your doctor, while still giving yourself time to heal.  Rest when you feel tired. You will be asked to do the following in order to speed your recovery:  - Cough and breathe deeply. This helps toclear and expand your lungs and can prevent pneumonia.  - Do mild physical activity. Walking or moving your legs help your circulation and body functions return to normal. A staff member will help you when you try to walk and will provide you with simple exercises. Do not try to get up or walk alone the first time. - Actively manage your pain. Managing your pain lets you move in comfort. We will ask you to rate your pain on a scale of zero to 10. It is your responsibility to tell your doctor or nurse where and how much you hurt so your pain can be treated.  Special Considerations -If you are diabetic, you may be placed on insulin after surgery to have closer control over your blood sugars to promote healing and recovery.  This does not mean that you will be discharged on insulin.  If applicable, your oral antidiabetics will be resumed when you are tolerating a solid diet.  -Your final pathology results from surgery should be available around one week after surgery and the results will be relayed to you when  available.  -FMLA forms can be faxed to [phone] and please allow 5-7 business days for completion.  Pain Management After Surgery -You have been prescribed your pain medication and bowel regimen medications before surgery so that you can have these available when you are discharged from the hospital. The pain medication is for use ONLY AFTER surgery and a new prescription will not be given.   -Make sure that you have Tylenol and Ibuprofen at  home to use on a regular basis after surgery for pain control. We recommend alternating the medications every hour to six hours since they work differently and are processed in the body differently for pain relief.  -Review the attached handout on narcotic use and their risks and side effects.   Bowel Regimen -You have been prescribed Sennakot-S to take nightly to prevent constipation especially if you are taking the narcotic pain medication intermittently.  It is important to prevent constipation and drink adequate amounts of liquids.  Blood Transfusion Information WHAT IS A BLOOD TRANSFUSION? A transfusion is the replacement of blood or some of its parts. Blood is made up of multiple cells which provide different functions.  Red blood cells carry oxygen and are used for blood loss replacement.  White blood cells fight against infection.  Platelets control bleeding.  Plasma helps clot blood.  Other blood products are available for specialized needs, such as hemophilia or other clotting disorders. BEFORE THE TRANSFUSION  Who gives blood for transfusions?   You may be able to donate blood to be used at a later date on yourself (autologous donation).  Relatives can be asked to donate blood. This is generally not any safer than if you have received blood from a stranger. The same precautions are taken to ensure safety when a relative's blood is donated.  Healthy volunteers who are fully evaluated to make sure their blood is safe. This is blood bank blood. Transfusion therapy is the safest it has ever been in the practice of medicine. Before blood is taken from a donor, a complete history is taken to make sure that person has no history of diseases nor engages in risky social behavior (examples are intravenous drug use or sexual activity with multiple partners). The donor's travel history is screened to minimize risk of transmitting infections, such as malaria. The donated blood is tested for  signs of infectious diseases, such as HIV and hepatitis. The blood is then tested to be sure it is compatible with you in order to minimize the chance of a transfusion reaction. If you or a relative donates blood, this is often done in anticipation of surgery and is not appropriate for emergency situations. It takes many days to process the donated blood. RISKS AND COMPLICATIONS Although transfusion therapy is very safe and saves many lives, the main dangers of transfusion include:   Getting an infectious disease.  Developing a transfusion reaction. This is an allergic reaction to something in the blood you were given. Every precaution is taken to prevent this. The decision to have a blood transfusion has been considered carefully by your caregiver before blood is given. Blood is not given unless the benefits outweigh the risks.  AFTER SURGERY INSTRUCTIONS 02/15/2019  Return to work: 4-6 weeks if applicable  Activity: 1. Be up and out of the bed during the day.  Take a nap if needed.  You may walk up steps but be careful and use the hand rail.  Stair climbing will tire you more than you think,  you may need to stop part way and rest.   2. No lifting or straining for 6 weeks.  3. No driving for 1 week(s).  Do not drive if you are taking narcotic pain medicine.  4. Shower daily.  Use soap and water on your incision and pat dry; don't rub.  No tub baths until cleared by your surgeon.   5. No sexual activity and nothing in the vagina for 8 weeks.  6. You may experience a small amount of clear drainage from your incisions, which is normal.  If the drainage persists or increases, please call the office.  7. You may experience vaginal spotting after surgery or around the 6-8 week mark from surgery when the stitches at the top of the vagina begin to dissolve.  The spotting is normal but if you experience heavy bleeding, call our office.  8. Take Tylenol or ibuprofen first for pain and only use  narcotic pain medication for severe pain not relieved by the Tylenol or Ibuprofen.  Monitor your Tylenol intake to a max of 4,000 mg.  Diet: 1. Low sodium Heart Healthy Diet is recommended.  2. It is safe to use a laxative, such as Miralax or Colace, if you have difficulty moving your bowels. You can take Sennakot at bedtime every evening to keep bowel movements regular and to prevent constipation.    Wound Care: 1. Keep clean and dry.  Shower daily.  Reasons to call the Doctor:  Fever - Oral temperature greater than 100.4 degrees Fahrenheit  Foul-smelling vaginal discharge  Difficulty urinating  Nausea and vomiting  Increased pain at the site of the incision that is unrelieved with pain medicine.  Difficulty breathing with or without chest pain  New calf pain especially if only on one side  Sudden, continuing increased vaginal bleeding with or without clots.   Contacts: For questions or concerns you should contact:  Dr. Jeral Pinch at [phone]  Joylene John, NP at [phone]  After Hours: call [phone] and have the GYN Oncologist paged/contacted

## 2019-02-16 ENCOUNTER — Other Ambulatory Visit: Payer: Self-pay | Admitting: Gynecologic Oncology

## 2019-02-16 DIAGNOSIS — N8502 Endometrial intraepithelial neoplasia [EIN]: Secondary | ICD-10-CM

## 2019-03-08 ENCOUNTER — Other Ambulatory Visit: Payer: Self-pay | Admitting: Obstetrics & Gynecology

## 2019-03-08 DIAGNOSIS — Z1231 Encounter for screening mammogram for malignant neoplasm of breast: Secondary | ICD-10-CM

## 2019-03-10 ENCOUNTER — Ambulatory Visit
Admission: RE | Admit: 2019-03-10 | Discharge: 2019-03-10 | Disposition: A | Discharge status: Home / Self Care | Payer: BC Managed Care – PPO | Source: Ambulatory Visit | Attending: Obstetrics & Gynecology | Admitting: Obstetrics & Gynecology

## 2019-03-10 ENCOUNTER — Other Ambulatory Visit: Payer: Self-pay

## 2019-03-10 DIAGNOSIS — Z1231 Encounter for screening mammogram for malignant neoplasm of breast: Secondary | ICD-10-CM

## 2019-03-11 DIAGNOSIS — Z86010 Personal history of colonic polyps: Secondary | ICD-10-CM | POA: Diagnosis not present

## 2019-03-11 DIAGNOSIS — K219 Gastro-esophageal reflux disease without esophagitis: Secondary | ICD-10-CM | POA: Diagnosis not present

## 2019-03-11 DIAGNOSIS — K200 Eosinophilic esophagitis: Secondary | ICD-10-CM | POA: Diagnosis not present

## 2019-03-19 NOTE — Patient Instructions (Signed)
Get your covid test at [facility] in [place]. Go to the cement overhang. It is a drive thru.   Kura Wainio     Your procedure is scheduled on 03/31/19   Report to [facility].   Call this number if you have problems the morning of surgery:[phone]   OUR ADDRESS IS [place], WE ARE LOCATED IN THE [facility] WITH [facility].    Do not eat food After Midnight.   YOU MAY HAVE CLEAR LIQUIDS FROM MIDNIGHT UNTIL 4:30 AM.   At 4:30 AM Please finish the prescribed Pre-Surgery  drink.   Nothing by mouth after you finish the  drink !   Take these medicines the morning of surgery with A SIP OF WATER  :Prilosec   Do not wear jewelry, make-up or nail polish.  Do not wear lotions, powders, or perfumes, or deoderant.  Do not shave 48 hours prior to surgery.    Do not bring valuables to the hospital.  [facility] is not responsible for any belongings or valuables.  Contacts, dentures or bridgework may not be worn into surgery.    For patients admitted to the hospital, discharge time will be determined by your treatment team.  Patients discharged the day of surgery will not be allowed to drive home.   Special instructions:    Please read over the following fact sheets that you were given:       [facility] - Preparing for Surgery  Before surgery, you can play an important role.   Because skin is not sterile, your skin needs to be as free of germs as possible.  You can reduce the number of germs on your skin by washing with CHG (chlorahexidine gluconate) soap before surgery.  CHG is an antiseptic cleaner which kills germs and bonds with the skin to continue killing germs even after washing. Please DO NOT use if you have an allergy to CHG or antibacterial soaps.   If your skin becomes reddened/irritated stop using the CHG and inform your nurse when you arrive at Short Stay. Do not shave (including legs and underarms)  for at least 48 hours prior to the first CHG shower.    Please follow these instructions carefully:  1.  Shower with CHG Soap the night before surgery and the  morning of Surgery.  2.  If you choose to wash your hair, wash your hair first as usual with your  normal  shampoo.  3.  After you shampoo, rinse your hair and body thoroughly to remove the  shampoo.                                        4.  Use CHG as you would any other liquid soap.  You can apply chg directly  to the skin and wash                       Gently with a scrungie or clean washcloth.  5.  Apply the CHG Soap to your body ONLY FROM THE NECK DOWN.   Do not use on face/ open                           Wound or open sores. Avoid contact with eyes, ears mouth  and genitals (private parts).                       Wash face,  Genitals (private parts) with your normal soap.             6.  Wash thoroughly, paying special attention to the area where your surgery  will be performed.  7.  Thoroughly rinse your body with warm water from the neck down.  8.  DO NOT shower/wash with your normal soap after using and rinsing off  the CHG Soap.             9.  Pat yourself dry with a clean towel.            10.  Wear clean pajamas.            11.  Place clean sheets on your bed the night of your first shower and do not  sleep with pets. Day of Surgery : Do not apply any lotions/deodorants the morning of surgery.  Please wear clean clothes to the hospital/surgery center.  FAILURE TO FOLLOW THESE INSTRUCTIONS MAY RESULT IN THE CANCELLATION OF YOUR SURGERY PATIENT SIGNATURE_________________________________  NURSE SIGNATURE__________________________________  ________________________________________________________________________   Adam Phenix  An incentive spirometer is a tool that can help keep your lungs clear and active. This tool measures how well you are filling your lungs with each breath. Taking long deep breaths may help  reverse or decrease the chance of developing breathing (pulmonary) problems (especially infection) following:  A long period of time when you are unable to move or be active. BEFORE THE PROCEDURE   If the spirometer includes an indicator to show your best effort, your nurse or respiratory therapist will set it to a desired goal.  If possible, sit up straight or lean slightly forward. Try not to slouch.  Hold the incentive spirometer in an upright position. INSTRUCTIONS FOR USE  1. Sit on the edge of your bed if possible, or sit up as far as you can in bed or on a chair. 2. Hold the incentive spirometer in an upright position. 3. Breathe out normally. 4. Place the mouthpiece in your mouth and seal your lips tightly around it. 5. Breathe in slowly and as deeply as possible, raising the piston or the ball toward the top of the column. 6. Hold your breath for 3-5 seconds or for as long as possible. Allow the piston or ball to fall to the bottom of the column. 7. Remove the mouthpiece from your mouth and breathe out normally. 8. Rest for a few seconds and repeat Steps 1 through 7 at least 10 times every 1-2 hours when you are awake. Take your time and take a few normal breaths between deep breaths. 9. The spirometer may include an indicator to show your best effort. Use the indicator as a goal to work toward during each repetition. 10. After each set of 10 deep breaths, practice coughing to be sure your lungs are clear. If you have an incision (the cut made at the time of surgery), support your incision when coughing by placing a pillow or rolled up towels firmly against it. Once you are able to get out of bed, walk around indoors and cough well. You may stop using the incentive spirometer when instructed by your caregiver.  RISKS AND COMPLICATIONS  Take your time so you do not get dizzy or light-headed.  If you are in pain, you may need to  take or ask for pain medication before doing incentive  spirometry. It is harder to take a deep breath if you are having pain. AFTER USE  Rest and breathe slowly and easily.  It can be helpful to keep track of a log of your progress. Your caregiver can provide you with a simple table to help with this. If you are using the spirometer at home, follow these instructions: [place] IF:   You are having difficultly using the spirometer.  You have trouble using the spirometer as often as instructed.  Your pain medication is not giving enough relief while using the spirometer.  You develop fever of 100.5 F (38.1 C) or higher. SEEK IMMEDIATE MEDICAL CARE IF:   You cough up bloody sputum that had not been present before.  You develop fever of 102 F (38.9 C) or greater.  You develop worsening pain at or near the incision site. MAKE SURE YOU:   Understand these instructions.  Will watch your condition.  Will get help right away if you are not doing well or get worse. Document Released: 06/10/2006 Document Revised: 04/22/2011 Document Reviewed: 08/11/2006 [facility] Patient Information 2014 [place], [place].   ________________________________________________________________________

## 2019-03-22 ENCOUNTER — Other Ambulatory Visit: Payer: Self-pay

## 2019-03-22 ENCOUNTER — Encounter (HOSPITAL_COMMUNITY): Payer: Self-pay

## 2019-03-22 ENCOUNTER — Encounter (HOSPITAL_COMMUNITY)
Admission: RE | Admit: 2019-03-22 | Discharge: 2019-03-22 | Disposition: A | Discharge status: Home / Self Care | Payer: BC Managed Care – PPO | Source: Ambulatory Visit | Attending: Gynecologic Oncology | Admitting: Gynecologic Oncology

## 2019-03-22 NOTE — Progress Notes (Signed)
PCP - dr. Loleta Chance Cardiologist - none  Chest x-ray - 03/10/19 EKG -  no Stress Test - no ECHO - no Cardiac Cath - no  Sleep Study - no CPAP - no  Fasting Blood Sugar - NA Checks Blood Sugar _____ times a day  Blood Thinner Instructions: NA Aspirin Instructions: Last Dose:  Anesthesia review:   Patient denies shortness of breath, fever, cough and chest pain at PAT appointment  yes Patient verbalized understanding of instructions that were given to them at the PAT appointment. Patient was also instructed that they will need to review over the PAT instructions again at home before surgery. yes

## 2019-03-25 ENCOUNTER — Telehealth: Payer: Self-pay | Admitting: *Deleted

## 2019-03-25 ENCOUNTER — Other Ambulatory Visit: Payer: Self-pay

## 2019-03-25 ENCOUNTER — Encounter (HOSPITAL_COMMUNITY)
Admission: RE | Admit: 2019-03-25 | Discharge: 2019-03-25 | Disposition: A | Discharge status: Home / Self Care | Payer: BC Managed Care – PPO | Source: Ambulatory Visit | Attending: Gynecologic Oncology | Admitting: Gynecologic Oncology

## 2019-03-25 DIAGNOSIS — N8501 Benign endometrial hyperplasia: Secondary | ICD-10-CM | POA: Insufficient documentation

## 2019-03-25 DIAGNOSIS — Z01812 Encounter for preprocedural laboratory examination: Secondary | ICD-10-CM | POA: Insufficient documentation

## 2019-03-25 LAB — COMPREHENSIVE METABOLIC PANEL
ALT: 18 U/L (ref 0–44)
AST: 16 U/L (ref 15–41)
Albumin: 3.7 g/dL (ref 3.5–5.0)
Alkaline Phosphatase: 95 U/L (ref 38–126)
Anion gap: 4 — ABNORMAL LOW (ref 5–15)
BUN: 13 mg/dL (ref 6–20)
CO2: 29 mmol/L (ref 22–32)
Calcium: 9.4 mg/dL (ref 8.9–10.3)
Chloride: 107 mmol/L (ref 98–111)
Creatinine, Ser: 0.91 mg/dL (ref 0.44–1.00)
GFR calc Af Amer: >60 mL/min (ref >60)
GFR calc non Af Amer: >60 mL/min (ref >60)
Glucose, Bld: 95 mg/dL (ref 70–99)
Potassium: 4.4 mmol/L (ref 3.5–5.1)
Sodium: 140 mmol/L (ref 135–145)
Total Bilirubin: 0.8 mg/dL (ref 0.3–1.2)
Total Protein: 7.2 g/dL (ref 6.5–8.1)

## 2019-03-25 LAB — CBC
HCT: 40.8 % (ref 36.0–46.0)
Hemoglobin: 13.1 g/dL (ref 12.0–15.0)
MCH: 28.1 pg (ref 26.0–34.0)
MCHC: 32.1 g/dL (ref 30.0–36.0)
MCV: 87.6 fL (ref 80.0–100.0)
Platelets: 321 10*3/uL (ref 150–400)
RBC: 4.66 MIL/uL (ref 3.87–5.11)
RDW: 13.2 % (ref 11.5–15.5)
WBC: 6.5 10*3/uL (ref 4.0–10.5)
nRBC: 0 % (ref 0.0–0.2)

## 2019-03-25 LAB — URINALYSIS, ROUTINE W REFLEX MICROSCOPIC
Bilirubin Urine: NEGATIVE
Glucose, UA: NEGATIVE mg/dL
Ketones, ur: NEGATIVE mg/dL
Nitrite: NEGATIVE
Protein, ur: NEGATIVE mg/dL
Specific Gravity, Urine: 1.02 (ref 1.005–1.030)
pH: 5 (ref 5.0–8.0)

## 2019-03-25 LAB — ABO/RH: ABO/RH(D): O POS

## 2019-03-26 ENCOUNTER — Telehealth: Payer: Self-pay | Admitting: *Deleted

## 2019-03-26 NOTE — Telephone Encounter (Signed)
Patient called and left a message stating "I wanted to make sure that you all have reviewed the lab results for this week. Specifically the urine, I'm not sure what I'm looking at but wanted to make sure it was ok."

## 2019-03-26 NOTE — Telephone Encounter (Signed)
Told Donna Shepard that her labs looked normal except that the urine is being checked for an infection. The urine culture will be back next week and she will be called with the results.

## 2019-03-27 ENCOUNTER — Other Ambulatory Visit (HOSPITAL_COMMUNITY)
Admission: RE | Admit: 2019-03-27 | Discharge: 2019-03-27 | Disposition: A | Discharge status: Home / Self Care | Payer: BC Managed Care – PPO | Source: Ambulatory Visit | Attending: Gynecologic Oncology | Admitting: Gynecologic Oncology

## 2019-03-27 DIAGNOSIS — Z20822 Contact with and (suspected) exposure to covid-19: Secondary | ICD-10-CM | POA: Insufficient documentation

## 2019-03-27 DIAGNOSIS — Z01812 Encounter for preprocedural laboratory examination: Secondary | ICD-10-CM | POA: Insufficient documentation

## 2019-03-27 LAB — SARS CORONAVIRUS 2 (TAT 6-24 HRS): SARS Coronavirus 2: NEGATIVE

## 2019-03-27 LAB — URINE CULTURE

## 2019-03-29 ENCOUNTER — Telehealth: Payer: Self-pay | Admitting: *Deleted

## 2019-03-29 NOTE — Telephone Encounter (Signed)
PC to patient with urine culture results of specimen contamination which was collected 03/25/19.  Pt denies any urinary symptoms, no need to recollect per M. Cross NP.  Pt verbalizes understanding.

## 2019-03-30 ENCOUNTER — Encounter (HOSPITAL_BASED_OUTPATIENT_CLINIC_OR_DEPARTMENT_OTHER): Payer: Self-pay | Admitting: Gynecologic Oncology

## 2019-03-30 ENCOUNTER — Telehealth: Payer: Self-pay

## 2019-03-30 NOTE — Anesthesia Preprocedure Evaluation (Addendum)
Anesthesia Evaluation  Patient identified by MRN, date of birth, ID band Patient awake    Reviewed: Allergy & Precautions, NPO status , Patient's Chart, lab work & pertinent test results  Airway Mallampati: II  TM Distance: >3 FB Neck ROM: Full    Dental no notable dental hx. (+) Teeth Intact, Dental Advisory Given   Pulmonary neg pulmonary ROS,    Pulmonary exam normal breath sounds clear to auscultation       Cardiovascular negative cardio ROS Normal cardiovascular exam Rhythm:Regular Rate:Normal     Neuro/Psych  Headaches, negative psych ROS   GI/Hepatic Neg liver ROS, GERD  Medicated and Controlled,  Endo/Other  Obesity BMI 37  Renal/GU negative Renal ROS  Female GU complaint Endometrial hyperplasia    Musculoskeletal negative musculoskeletal ROS (+)   Abdominal (+) + obese,   Peds negative pediatric ROS (+)  Hematology negative hematology ROS (+)   Anesthesia Other Findings   Reproductive/Obstetrics negative OB ROS                            Anesthesia Physical Anesthesia Plan  ASA: II  Anesthesia Plan: General   Post-op Pain Management:    Induction: Intravenous  PONV Risk Score and Plan: 4 or greater and Ondansetron, Dexamethasone, Scopolamine patch - Pre-op, Midazolam and Treatment may vary due to age or medical condition  Airway Management Planned: Oral ETT  Additional Equipment: None  Intra-op Plan:   Post-operative Plan: Extubation in OR  Informed Consent: I have reviewed the patients History and Physical, chart, labs and discussed the procedure including the risks, benefits and alternatives for the proposed anesthesia with the patient or authorized representative who has indicated his/her understanding and acceptance.     Dental advisory given  Plan Discussed with: CRNA  Anesthesia Plan Comments:         Anesthesia Quick Evaluation

## 2019-03-30 NOTE — Telephone Encounter (Signed)
Donna Shepard stated that she understands her pre op instructions. Reviewed some information with patient and she verbalized understanding.

## 2019-03-31 ENCOUNTER — Ambulatory Visit (HOSPITAL_BASED_OUTPATIENT_CLINIC_OR_DEPARTMENT_OTHER)
Admission: RE | Admit: 2019-03-31 | Discharge: 2019-03-31 | Disposition: A | Discharge status: Home / Self Care | Payer: BC Managed Care – PPO | Attending: Gynecologic Oncology | Admitting: Gynecologic Oncology

## 2019-03-31 ENCOUNTER — Encounter (HOSPITAL_BASED_OUTPATIENT_CLINIC_OR_DEPARTMENT_OTHER): Payer: Self-pay | Admitting: Gynecologic Oncology

## 2019-03-31 ENCOUNTER — Ambulatory Visit (HOSPITAL_BASED_OUTPATIENT_CLINIC_OR_DEPARTMENT_OTHER): Payer: BC Managed Care – PPO | Admitting: Physician Assistant

## 2019-03-31 ENCOUNTER — Ambulatory Visit (HOSPITAL_BASED_OUTPATIENT_CLINIC_OR_DEPARTMENT_OTHER): Payer: BC Managed Care – PPO | Admitting: Anesthesiology

## 2019-03-31 ENCOUNTER — Other Ambulatory Visit: Payer: Self-pay

## 2019-03-31 ENCOUNTER — Encounter (HOSPITAL_BASED_OUTPATIENT_CLINIC_OR_DEPARTMENT_OTHER)
Admission: RE | Disposition: A | Discharge status: Home / Self Care | Payer: Self-pay | Source: Home / Self Care | Attending: Gynecologic Oncology

## 2019-03-31 DIAGNOSIS — N8502 Endometrial intraepithelial neoplasia [EIN]: Secondary | ICD-10-CM | POA: Diagnosis not present

## 2019-03-31 DIAGNOSIS — N83202 Unspecified ovarian cyst, left side: Secondary | ICD-10-CM | POA: Diagnosis not present

## 2019-03-31 DIAGNOSIS — D251 Intramural leiomyoma of uterus: Secondary | ICD-10-CM | POA: Insufficient documentation

## 2019-03-31 DIAGNOSIS — N83201 Unspecified ovarian cyst, right side: Secondary | ICD-10-CM | POA: Insufficient documentation

## 2019-03-31 DIAGNOSIS — K219 Gastro-esophageal reflux disease without esophagitis: Secondary | ICD-10-CM | POA: Insufficient documentation

## 2019-03-31 DIAGNOSIS — E669 Obesity, unspecified: Secondary | ICD-10-CM | POA: Insufficient documentation

## 2019-03-31 DIAGNOSIS — Z6836 Body mass index (BMI) 36.0-36.9, adult: Secondary | ICD-10-CM | POA: Diagnosis not present

## 2019-03-31 DIAGNOSIS — N838 Other noninflammatory disorders of ovary, fallopian tube and broad ligament: Secondary | ICD-10-CM | POA: Insufficient documentation

## 2019-03-31 DIAGNOSIS — N8500 Endometrial hyperplasia, unspecified: Secondary | ICD-10-CM | POA: Diagnosis not present

## 2019-03-31 HISTORY — PX: ROBOTIC ASSISTED TOTAL HYSTERECTOMY WITH BILATERAL SALPINGO OOPHERECTOMY: SHX6086

## 2019-03-31 HISTORY — PX: SENTINEL NODE BIOPSY: SHX6608

## 2019-03-31 LAB — TYPE AND SCREEN
ABO/RH(D): O POS
Antibody Screen: NEGATIVE

## 2019-03-31 SURGERY — HYSTERECTOMY, TOTAL, ROBOT-ASSISTED, LAPAROSCOPIC, WITH BILATERAL SALPINGO-OOPHORECTOMY
Anesthesia: General | Site: Abdomen

## 2019-03-31 MED ORDER — LACTATED RINGERS IV SOLN
INTRAVENOUS | Status: DC
  Filled 2019-03-31: qty 1000

## 2019-03-31 MED ORDER — ONDANSETRON HCL 4 MG/2ML IJ SOLN
4.0000 mg | Freq: Four times a day (QID) | INTRAMUSCULAR | Status: DC | PRN
  Filled 2019-03-31: qty 2

## 2019-03-31 MED ORDER — SUGAMMADEX SODIUM 200 MG/2ML IV SOLN
INTRAVENOUS | Status: DC | PRN
  Administered 2019-03-31: 400 mg via INTRAVENOUS

## 2019-03-31 MED ORDER — CIPROFLOXACIN IN D5W 400 MG/200ML IV SOLN
INTRAVENOUS | Status: AC
  Filled 2019-03-31: qty 200

## 2019-03-31 MED ORDER — MEPERIDINE HCL 25 MG/ML IJ SOLN
6.2500 mg | INTRAMUSCULAR | Status: DC | PRN
  Filled 2019-03-31: qty 1

## 2019-03-31 MED ORDER — SCOPOLAMINE 1 MG/3DAYS TD PT72
MEDICATED_PATCH | TRANSDERMAL | Status: AC
  Filled 2019-03-31: qty 1

## 2019-03-31 MED ORDER — ROCURONIUM BROMIDE 10 MG/ML (PF) SYRINGE
PREFILLED_SYRINGE | INTRAVENOUS | Status: DC | PRN
  Administered 2019-03-31: 20 mg via INTRAVENOUS
  Administered 2019-03-31: 10 mg via INTRAVENOUS
  Administered 2019-03-31: 50 mg via INTRAVENOUS

## 2019-03-31 MED ORDER — PROPOFOL 10 MG/ML IV BOLUS
INTRAVENOUS | Status: AC
  Filled 2019-03-31: qty 40

## 2019-03-31 MED ORDER — HYDROMORPHONE HCL 1 MG/ML IJ SOLN
0.2500 mg | INTRAMUSCULAR | Status: DC | PRN
  Filled 2019-03-31: qty 0.5

## 2019-03-31 MED ORDER — FENTANYL CITRATE (PF) 100 MCG/2ML IJ SOLN
INTRAMUSCULAR | Status: DC | PRN
  Administered 2019-03-31 (×2): 50 ug via INTRAVENOUS
  Administered 2019-03-31: 100 ug via INTRAVENOUS

## 2019-03-31 MED ORDER — EPHEDRINE SULFATE-NACL 50-0.9 MG/10ML-% IV SOSY
PREFILLED_SYRINGE | INTRAVENOUS | Status: DC | PRN
  Administered 2019-03-31 (×2): 5 mg via INTRAVENOUS
  Administered 2019-03-31: 10 mg via INTRAVENOUS

## 2019-03-31 MED ORDER — MIDAZOLAM HCL 5 MG/5ML IJ SOLN
INTRAMUSCULAR | Status: DC | PRN
  Administered 2019-03-31: 2 mg via INTRAVENOUS

## 2019-03-31 MED ORDER — MIDAZOLAM HCL 2 MG/2ML IJ SOLN
INTRAMUSCULAR | Status: AC
  Filled 2019-03-31: qty 2

## 2019-03-31 MED ORDER — HEPARIN SODIUM (PORCINE) 5000 UNIT/ML IJ SOLN
INTRAMUSCULAR | Status: AC
  Filled 2019-03-31: qty 1

## 2019-03-31 MED ORDER — ACETAMINOPHEN 500 MG PO TABS
1000.0000 mg | ORAL_TABLET | Freq: Once | ORAL | Status: DC
  Filled 2019-03-31: qty 2

## 2019-03-31 MED ORDER — GABAPENTIN 300 MG PO CAPS
ORAL_CAPSULE | ORAL | Status: AC
  Filled 2019-03-31: qty 1

## 2019-03-31 MED ORDER — TRAMADOL HCL 50 MG PO TABS
ORAL_TABLET | ORAL | Status: AC
  Filled 2019-03-31: qty 1

## 2019-03-31 MED ORDER — ENSURE PRE-SURGERY PO LIQD
296.0000 mL | Freq: Once | ORAL | Status: DC
  Filled 2019-03-31: qty 296

## 2019-03-31 MED ORDER — SCOPOLAMINE 1 MG/3DAYS TD PT72
1.0000 | MEDICATED_PATCH | TRANSDERMAL | Status: DC
  Administered 2019-03-31: 1.5 mg via TRANSDERMAL
  Filled 2019-03-31: qty 1

## 2019-03-31 MED ORDER — HYDROMORPHONE HCL 1 MG/ML IJ SOLN
0.2000 mg | INTRAMUSCULAR | Status: DC | PRN
  Filled 2019-03-31: qty 1

## 2019-03-31 MED ORDER — DEXAMETHASONE SODIUM PHOSPHATE 10 MG/ML IJ SOLN
INTRAMUSCULAR | Status: AC
  Filled 2019-03-31: qty 2

## 2019-03-31 MED ORDER — SCOPOLAMINE 1 MG/3DAYS TD PT72
1.0000 | MEDICATED_PATCH | TRANSDERMAL | Status: DC
  Filled 2019-03-31: qty 1

## 2019-03-31 MED ORDER — DEXAMETHASONE SODIUM PHOSPHATE 4 MG/ML IJ SOLN
4.0000 mg | INTRAMUSCULAR | Status: DC
  Filled 2019-03-31: qty 1

## 2019-03-31 MED ORDER — TRAMADOL HCL 50 MG PO TABS
50.0000 mg | ORAL_TABLET | Freq: Four times a day (QID) | ORAL | Status: DC | PRN
  Administered 2019-03-31: 50 mg via ORAL
  Filled 2019-03-31: qty 1

## 2019-03-31 MED ORDER — DEXAMETHASONE SODIUM PHOSPHATE 10 MG/ML IJ SOLN
INTRAMUSCULAR | Status: DC | PRN
  Administered 2019-03-31: 10 mg via INTRAVENOUS

## 2019-03-31 MED ORDER — OXYCODONE HCL 5 MG PO TABS
5.0000 mg | ORAL_TABLET | Freq: Once | ORAL | Status: DC | PRN
  Filled 2019-03-31: qty 1

## 2019-03-31 MED ORDER — CELECOXIB 200 MG PO CAPS
ORAL_CAPSULE | ORAL | Status: AC
  Filled 2019-03-31: qty 2

## 2019-03-31 MED ORDER — LIDOCAINE 2% (20 MG/ML) 5 ML SYRINGE
INTRAMUSCULAR | Status: AC
  Filled 2019-03-31: qty 10

## 2019-03-31 MED ORDER — ROCURONIUM BROMIDE 10 MG/ML (PF) SYRINGE
PREFILLED_SYRINGE | INTRAVENOUS | Status: AC
  Filled 2019-03-31: qty 20

## 2019-03-31 MED ORDER — LIDOCAINE 2% (20 MG/ML) 5 ML SYRINGE
INTRAMUSCULAR | Status: DC | PRN
  Administered 2019-03-31: 60 mg via INTRAVENOUS

## 2019-03-31 MED ORDER — ACETAMINOPHEN 500 MG PO TABS
1000.0000 mg | ORAL_TABLET | ORAL | Status: AC
  Administered 2019-03-31: 1000 mg via ORAL
  Filled 2019-03-31: qty 2

## 2019-03-31 MED ORDER — OXYCODONE HCL 5 MG/5ML PO SOLN
5.0000 mg | Freq: Once | ORAL | Status: DC | PRN
  Filled 2019-03-31: qty 5

## 2019-03-31 MED ORDER — FENTANYL CITRATE (PF) 250 MCG/5ML IJ SOLN
INTRAMUSCULAR | Status: AC
  Filled 2019-03-31: qty 5

## 2019-03-31 MED ORDER — GABAPENTIN 300 MG PO CAPS
300.0000 mg | ORAL_CAPSULE | ORAL | Status: AC
  Administered 2019-03-31: 07:00:00 300 mg via ORAL
  Filled 2019-03-31: qty 1

## 2019-03-31 MED ORDER — CLINDAMYCIN PHOSPHATE 900 MG/50ML IV SOLN
INTRAVENOUS | Status: AC
  Filled 2019-03-31: qty 50

## 2019-03-31 MED ORDER — CLINDAMYCIN PHOSPHATE 900 MG/50ML IV SOLN
900.0000 mg | INTRAVENOUS | Status: AC
  Administered 2019-03-31: 900 mg via INTRAVENOUS
  Filled 2019-03-31: qty 50

## 2019-03-31 MED ORDER — PROMETHAZINE HCL 25 MG/ML IJ SOLN
6.2500 mg | INTRAMUSCULAR | Status: DC | PRN
  Filled 2019-03-31: qty 1

## 2019-03-31 MED ORDER — ONDANSETRON HCL 4 MG PO TABS
4.0000 mg | ORAL_TABLET | Freq: Four times a day (QID) | ORAL | Status: DC | PRN
  Filled 2019-03-31: qty 1

## 2019-03-31 MED ORDER — BUPIVACAINE HCL (PF) 0.5 % IJ SOLN
INTRAMUSCULAR | Status: DC | PRN
  Administered 2019-03-31: 30 mL

## 2019-03-31 MED ORDER — ONDANSETRON HCL 4 MG/2ML IJ SOLN
INTRAMUSCULAR | Status: AC
  Filled 2019-03-31: qty 4

## 2019-03-31 MED ORDER — PROPOFOL 10 MG/ML IV BOLUS
INTRAVENOUS | Status: DC | PRN
  Administered 2019-03-31: 150 mg via INTRAVENOUS

## 2019-03-31 MED ORDER — SODIUM CHLORIDE 0.9 % IR SOLN
Status: DC | PRN
  Administered 2019-03-31: 250 mL

## 2019-03-31 MED ORDER — HEPARIN SODIUM (PORCINE) 5000 UNIT/ML IJ SOLN
5000.0000 [IU] | INTRAMUSCULAR | Status: AC
  Administered 2019-03-31: 5000 [IU] via SUBCUTANEOUS
  Filled 2019-03-31: qty 1

## 2019-03-31 MED ORDER — ACETAMINOPHEN 500 MG PO TABS
ORAL_TABLET | ORAL | Status: AC
  Filled 2019-03-31: qty 2

## 2019-03-31 MED ORDER — CIPROFLOXACIN IN D5W 400 MG/200ML IV SOLN
400.0000 mg | INTRAVENOUS | Status: AC
  Administered 2019-03-31: 400 mg via INTRAVENOUS
  Filled 2019-03-31: qty 200

## 2019-03-31 MED ORDER — OXYCODONE HCL 5 MG PO TABS
5.0000 mg | ORAL_TABLET | ORAL | Status: DC | PRN
  Filled 2019-03-31: qty 2

## 2019-03-31 MED ORDER — EPHEDRINE 5 MG/ML INJ
INTRAVENOUS | Status: AC
  Filled 2019-03-31: qty 10

## 2019-03-31 MED ORDER — CELECOXIB 400 MG PO CAPS
400.0000 mg | ORAL_CAPSULE | ORAL | Status: AC
  Administered 2019-03-31: 400 mg via ORAL
  Filled 2019-03-31: qty 1

## 2019-03-31 MED ORDER — KETOROLAC TROMETHAMINE 30 MG/ML IJ SOLN
30.0000 mg | Freq: Once | INTRAMUSCULAR | Status: DC | PRN
  Filled 2019-03-31: qty 1

## 2019-03-31 SURGICAL SUPPLY — 65 items
ADH SKN CLS APL DERMABOND .7 (GAUZE/BANDAGES/DRESSINGS) ×2
AGENT HMST KT MTR STRL THRMB (HEMOSTASIS)
APL ESCP 34 STRL LF DISP (HEMOSTASIS)
APPLICATOR SURGIFLO ENDO (HEMOSTASIS) IMPLANT
BAG LAPAROSCOPIC 12 15 PORT 16 (BASKET) IMPLANT
BAG RETRIEVAL 12/15 (BASKET)
BAG SPEC RTRVL LRG 6X4 10 (ENDOMECHANICALS)
BLADE SURG 10 STRL SS (BLADE) IMPLANT
COVER BACK TABLE 60X90IN (DRAPES) ×3 IMPLANT
COVER TIP SHEARS 8 DVNC (MISCELLANEOUS) ×2 IMPLANT
COVER TIP SHEARS 8MM DA VINCI (MISCELLANEOUS) ×1
COVER WAND RF STERILE (DRAPES) ×3 IMPLANT
DECANTER SPIKE VIAL GLASS SM (MISCELLANEOUS) IMPLANT
DERMABOND ADVANCED (GAUZE/BANDAGES/DRESSINGS) ×1
DERMABOND ADVANCED .7 DNX12 (GAUZE/BANDAGES/DRESSINGS) ×2 IMPLANT
DRAPE ARM DVNC X/XI (DISPOSABLE) ×8 IMPLANT
DRAPE COLUMN DVNC XI (DISPOSABLE) ×2 IMPLANT
DRAPE DA VINCI XI ARM (DISPOSABLE) ×4
DRAPE DA VINCI XI COLUMN (DISPOSABLE) ×1
DRAPE SHEET LG 3/4 BI-LAMINATE (DRAPES) ×3 IMPLANT
DRAPE SURG IRRIG POUCH 19X23 (DRAPES) ×3 IMPLANT
ELECT REM PT RETURN 9FT ADLT (ELECTROSURGICAL) ×3
ELECTRODE REM PT RTRN 9FT ADLT (ELECTROSURGICAL) ×2 IMPLANT
GAUZE 4X4 16PLY RFD (DISPOSABLE) ×3 IMPLANT
GLOVE BIO SURGEON STRL SZ 6 (GLOVE) ×12 IMPLANT
GLOVE BIO SURGEON STRL SZ 6.5 (GLOVE) ×6 IMPLANT
HOLDER FOLEY CATH W/STRAP (MISCELLANEOUS) ×3 IMPLANT
IRRIG SUCT STRYKERFLOW 2 WTIP (MISCELLANEOUS) ×3
IRRIGATION SUCT STRKRFLW 2 WTP (MISCELLANEOUS) ×2 IMPLANT
KIT PROCEDURE DA VINCI SI (MISCELLANEOUS) ×1
KIT PROCEDURE DVNC SI (MISCELLANEOUS) ×2 IMPLANT
KIT TURNOVER CYSTO (KITS) IMPLANT
LEGGING LITHOTOMY PAIR STRL (DRAPES) ×3 IMPLANT
MANIPULATOR UTERINE 4.5 ZUMI (MISCELLANEOUS) ×3 IMPLANT
NDL SAFETY ECLIPSE 18X1.5 (NEEDLE) IMPLANT
NEEDLE HYPO 18GX1.5 SHARP (NEEDLE)
NEEDLE HYPO 22GX1.5 SAFETY (NEEDLE) ×3 IMPLANT
NEEDLE SPNL 18GX3.5 QUINCKE PK (NEEDLE) ×3 IMPLANT
OBTURATOR OPTICAL STANDARD 8MM (TROCAR) ×1
OBTURATOR OPTICAL STND 8 DVNC (TROCAR) ×2
OBTURATOR OPTICALSTD 8 DVNC (TROCAR) ×2 IMPLANT
PACK ROBOT GYN CUSTOM WL (TRAY / TRAY PROCEDURE) ×3 IMPLANT
PACK ROBOTIC GOWN (GOWN DISPOSABLE) ×3 IMPLANT
PAD OB MATERNITY 4.3X12.25 (PERSONAL CARE ITEMS) ×3 IMPLANT
PAD POSITIONING PINK XL (MISCELLANEOUS) ×3 IMPLANT
PENCIL BUTTON HOLSTER BLD 10FT (ELECTRODE) IMPLANT
PORT ACCESS TROCAR AIRSEAL 12 (TROCAR) ×2 IMPLANT
PORT ACCESS TROCAR AIRSEAL 5M (TROCAR) ×1
POUCH SPECIMEN RETRIEVAL 10MM (ENDOMECHANICALS) IMPLANT
SEAL CANN UNIV 5-8 DVNC XI (MISCELLANEOUS) ×6 IMPLANT
SEAL XI 5MM-8MM UNIVERSAL (MISCELLANEOUS) ×3
SET TRI-LUMEN FLTR TB AIRSEAL (TUBING) ×3 IMPLANT
SURGIFLO W/THROMBIN 8M KIT (HEMOSTASIS) IMPLANT
SUT VIC AB 0 CT1 36 (SUTURE) IMPLANT
SUT VIC AB 3-0 SH 27 (SUTURE)
SUT VIC AB 3-0 SH 27X BRD (SUTURE) IMPLANT
SUT VIC AB 4-0 PS2 18 (SUTURE) ×6 IMPLANT
SYR 10ML LL (SYRINGE) ×3 IMPLANT
TRAP SPECIMEN MUCOUS 40CC (MISCELLANEOUS) IMPLANT
TRAY FOLEY W/BAG SLVR 14FR (SET/KITS/TRAYS/PACK) ×3 IMPLANT
TROCAR XCEL 12X100 BLDLESS (ENDOMECHANICALS) IMPLANT
TUBE CONNECTING 12X1/4 (SUCTIONS) ×3 IMPLANT
UNDERPAD 30X30 (UNDERPADS AND DIAPERS) ×3 IMPLANT
WATER STERILE IRR 1000ML POUR (IV SOLUTION) IMPLANT
YANKAUER SUCT BULB TIP NO VENT (SUCTIONS) IMPLANT

## 2019-03-31 NOTE — Anesthesia Postprocedure Evaluation (Signed)
Anesthesia Post Note  Patient: Donna Shepard  Procedure(s) Performed: XI ROBOTIC ASSISTED TOTAL HYSTERECTOMY WITH BILATERAL SALPINGO OOPHORECTOMY (Bilateral Abdomen) SENTINEL NODE BIOPSY (N/A Abdomen)     Patient location during evaluation: PACU Anesthesia Type: General Level of consciousness: awake and alert, oriented and patient cooperative Pain management: pain level controlled Vital Signs Assessment: post-procedure vital signs reviewed and stable Respiratory status: spontaneous breathing, nonlabored ventilation and respiratory function stable Cardiovascular status: blood pressure returned to baseline and stable Postop Assessment: no apparent nausea or vomiting Anesthetic complications: no    Last Vitals:  Vitals:   03/31/19 1115 03/31/19 1130  BP: (!) 151/90 (!) 140/92  Pulse: 61 68  Resp: 19 17  Temp:    SpO2: 94% 96%    Last Pain:  Vitals:   03/31/19 1100  TempSrc:   PainSc: 0-No pain                 Jarome Matin [name]

## 2019-03-31 NOTE — Op Note (Signed)
OPERATIVE NOTE  Pre-operative Diagnosis: CAH  Post-operative Diagnosis: same  Operation: Robotic-assisted laparoscopic total hysterectomy with bilateral salpingoophorectomy, SLN biopsy   Surgeon: Jeral Pinch MD  Assistant Surgeon: Joylene John, NP  Anesthesia: GET  Urine Output: 150 cc  Operative Findings: On EUA, small mobile uterus. On intra-abdominal survey, normal appearing diaphragm, liver edge, small and large bowel. Normal appearing omentum. Normal appearing bilateral adnexa. Uterus 8-10cm with 2-3cm posterior and fundal fibroid. Mapping successful to bilateral SLNs (external iliac and obturator on the right, obturator on the left)  Estimated Blood Loss:  50 cc      Total IV Fluids: 1200 ml         Specimens: uterus, cervix, bilateral tubes and ovaries, SLNs (right external iliac, right obturator, left obturator)         Complications:  None apparent; patient tolerated the procedure well.         Disposition: PACU - hemodynamically stable.  Procedure Details  The patient was seen in the Holding Room. The risks, benefits, complications, treatment options, and expected outcomes were discussed with the patient.  The patient concurred with the proposed plan, giving informed consent.  The site of surgery properly noted/marked. The patient was identified as Donna Shepard and the procedure verified as a Robotic-assisted hysterectomy with bilateral salpingo oophorectomy with SLN biopsy.   After induction of anesthesia, the patient was draped and prepped in the usual sterile manner. Patient was placed in supine position after anesthesia and draped and prepped in the usual sterile manner as follows: Her arms were tucked to her side with all appropriate precautions.  The shoulders were stabilized with padded shoulder blocks applied to the acromium processes.  The patient was placed in the semi-lithotomy position in [place].  The perineum and vagina were prepped with  CholoraPrep. The patient was draped after the CholoraPrep had been allowed to dry for 3 minutes.  A Time Out was held and the above information confirmed.  The urethra was prepped with Betadine. Foley catheter was placed.  A sterile speculum was placed in the vagina.  The cervix was grasped with a single-tooth tenaculum. 2mg  total of ICG was injected into the cervical stroma at 2 and 9 o'clock with 1cc injected at a 1cm and 7mm depth (concentration 0.5mg /ml) in all locations. The cervix was dilated with Kennon Rounds dilators.  The ZUMI uterine manipulator with a medium colpotomizer ring was placed without difficulty.  OG tube placement was confirmed and to suction.   Next, a 10 mm skin incision was made 1 cm below the subcostal margin in the midclavicular line.  The 5 mm Optiview port and scope was used for direct entry.  Opening pressure was under 10 mm CO2.  The abdomen was insufflated and the findings were noted as above.   At this point and all points during the procedure, the patient's intra-abdominal pressure did not exceed 15 mmHg. Next, an 8 mm skin incision was made superior to the umbilicus and a right and left port were placed about 8 cm lateral to the robot port on the right and left side.  A fourth arm was placed on the right.  The 5 mm assist trocar was exchanged for a 10-12 mm port. All ports were placed under direct visualization.  The patient was placed in steep Trendelenburg.  Bowel was folded away into the upper abdomen.  The robot was docked in the normal manner.  The right and left peritoneum were opened parallel to the IP ligament  to open the retroperitoneal spaces bilaterally. The round ligaments were transected. The SLN mapping was performed in bilateral pelvic basins. After identifying the ureters, the para rectal and paravesical spaces were opened up entirely with careful dissection below the level of the ureters bilaterally and to the depth of the uterine artery origin in order to  skeletonize the uterine "web" and ensure visualization of all parametrial channels. The para-aortic basins were carefully exposed and evaluated for isolated para-aortic SLN's. Lymphatic channels were identified travelling to the following visualized sentinel lymph node's: right external iliac, right obturator, left obturator. These SLN's were separated from their surrounding lymphatic tissue, removed and sent for permanent pathology.  The hysterectomy was started.  The ureter was again noted to be on the medial leaf of the broad ligament.  The peritoneum above the ureter was incised and stretched and the infundibulopelvic ligament was skeletonized, cauterized and cut.  The posterior peritoneum was taken down to the level of the KOH ring.  The anterior peritoneum was also taken down.  The bladder flap was created to the level of the KOH ring.  The uterine artery on the right side was skeletonized, cauterized and cut in the normal manner.  A similar procedure was performed on the left.  The colpotomy was made and the uterus, cervix, bilateral ovaries and tubes were amputated and delivered through the vagina.  Pedicles were inspected and excellent hemostasis was achieved.    The colpotomy at the vaginal cuff was closed with Vicryl on a CT1 needle in running manner.  Irrigation was used and excellent hemostasis was achieved.  At this point in the procedure was completed.  Robotic instruments were removed under direct visulaization.  The robot was undocked. The fascia at the 10-12 mm port was closed with 0 Vicryl on a UR-5 needle.  The subcuticular tissue was closed with 4-0 Vicryl and the skin was closed with 4-0 Monocryl in mattress fashion.  Dermabond was applied.    The vagina was swabbed with  minimal bleeding noted.   All sponge, lap and needle counts were correct x 2.   The patient was transferred to the recovery room in stable condition.  Jeral Pinch, MD

## 2019-03-31 NOTE — H&P (Signed)
GYN ONC H&P  Assessment/Plan:  54 year old with complex atypical hyperplasia.  We reviewed the diagnosis of complex atypical hyperplasia (CAH) and the treatment options, including medical management (Mirena IUD or progesterone PO) or hysterectomy. Given that she is a suitable candidate for surgery and that she has completed childbearing, I recommend leaving forward with definitive surgery.  The patient is a suitable candidate for hysterectomy via a minimally invasive approach to surgery. Given that she is postmenopausal, a bilateral salpingo-oophorectomy is also recommended. We reviewed that robotic assistance would be used to complete the surgery. We discussed that endometrial cancer is detected in about 40% of final uterine pathology specimens from patients with CAH. We discussed that standard of care would be to send the uterus for intraoperative frozen pathology help dictate the need for additional procedures including lymph node evaluation if a cancer is identified. We also discussed the possibility of a sentinel lymph node biopsy at the start of the procedure to obviate the need for a full lymph node dissection if cancer is identified on frozen section. We discussed the risks of both the sentinel lymph node biopsy as well as full lymph node dissection and the patient feels comfortable proceeding with sentinel lymph node biopsy.  We reviewed the sentinel lymph node technique. Risks and benefits of sentinel lymph node biopsy was reviewed. We reviewed the technique and ICG dye. The patient DOES NOT have an iodine allergy or known liver dysfunction. We reviewed the false negative rate (0.4%), and that 3% of patients with metastatic disease will not have it detected by SLN biopsy in endometrial cancer. A low risk of allergic reaction to the dye, <0.2% for ICG, has been reported. We also discussed that in the case of failed mapping, which occurs 40% of the time, a bilateral or unilateral lymphadenectomy will  be performed at the surgeon's discretion.  Potential benefits of sentinel nodes including a higher detection rate for metastasis due to ultrastaging and potential reduction in operative morbidity. However, there remains uncertainty as to the role for treatment of micrometastatic disease. Further, the benefit of operative morbidity associated with the SLN technique in endometrial cancer is not yet completely known. In other patient populations (e.g. the cervical cancer population) there has been observed reductions in morbidity with SLN biopsy compared to pelvic lymphadenectomy. Lymphedema, nerve dysfunction and lymphocysts are all potential risks with the SLN technique as with complete lymphadenectomy. Additional risks to the patient include the risk of damage to an internal organ while operating in an altered view (e.g. the black and white image of the robotic fluorescence imaging mode).  The patient was consented for a robotic assisted hysterectomy, bilateral salpingo-oophorectomy, sentinel lymph node evaluation, possible lymph node dissection, possible laparotomy. If there is failure to map, given a diagnosis of CAH, I will plan to send the uterus for frozen section during surgery. The risks of surgery were discussed in detail and she understands these to include infection; wound separation; hernia; vaginal cuff separation, injury to adjacent organs such as bowel, bladder, blood vessels, ureters and nerves; bleeding which may require blood transfusion; anesthesia risk; thromboembolic events; possible death; unforeseen complications; possible need for re-exploration; medical complications such as heart attack, stroke, pleural effusion and pneumonia; and, if full lymphadenectomy is performed the risk of lymphedema and lymphocyst. The patient will receive DVT and antibiotic prophylaxis as indicated. She voiced a clear understanding. She had the opportunity to ask questions. Perioperative instructions were reviewed  with her. Prescriptions for post-op medications were sent to her  pharmacy of choice.   Jeral Pinch, MD  Division of Gynecologic Oncology  Department of Obstetrics and Gynecology  [facility]  ____________   History of Present Illness:  Donna Shepard is a 54 y.o. y.o. female who is seen in consultation at the request of No ref. provider found for an evaluation of PMB.  Based on cessation of menses as well as hormone levels, the patient was thought to be in menopause as of last January or February. She was also having night sweats at the time and started taking Estroven over-the-counter which helped with the symptoms. She has seen Dr. Sabra Heck on and off for abnormal bleeding in the past. The week before Christmas, she had some light bleeding that she thought was related to a urinary tract infection. She placed a tampon and noted that it was vaginal bleeding. She called to be seen and on 12/22, underwent endometrial biopsy with final pathology revealing complex atypical hyperplasia. She has had no bleeding since the biopsy. She notes some fullness in her lower quadrants and has a spot where she feels some discomfort intermittently in her right lower pelvis. She endorses having a good appetite without nausea or vomiting. She reports normal bowel and bladder function.   Her husband is scheduled for a knee replacement on Monday. She denies any shortness of breath or chest pain with ambulation or at rest.   PAST MEDICAL HISTORY:      Past Medical History:  Diagnosis Date  . Esophagitis 06/2015   after 06/2015 endoscopy   . Migraines    no aura  . Syncopal episodes    situational   PAST SURGICAL HISTORY:  History reviewed. No pertinent surgical history.   OB/GYN HISTORY:                  OB History  Gravida Para Term Preterm AB Living  2 2 2   2   SAB TAB Ectopic Multiple Live Births         2       # Outcome Date GA Lbr Len/2nd Weight Sex Delivery Anes  PTL Lv  2 Term     F Vag-Spont   LIV  1 Term     F Vag-Spont   LIV   Patient's last menstrual period was 02/20/2018 (approximate).  Age at menarche: 30  Age at menopause: 3  Hx of HRT: denies, uses OTC Estroven  Hx of STDs: no  Last pap: 03/2018, negative  History of abnormal pap smears: no   SCREENING STUDIES:  Last mammogram: 01/2018  Last colonoscopy: 2017, due in 5 years  Last bone mineral density: n/a  MEDICATIONS:      Outpatient Encounter Medications as of 02/15/2019  Medication Sig  . Rhubarb (ESTROVEN MENOPAUSE RELIEF PO) Take by mouth.  . Vitamin D, Ergocalciferol, (DRISDOL) 1.25 MG (50000 UT) CAPS capsule Take 1 capsule (50,000 Units total) by mouth every 7 (seven) days.  . Aspirin-Acetaminophen-Caffeine (EXCEDRIN PO) Take by mouth.  Marland Kitchen ibuprofen (ADVIL) 800 MG tablet Take 1 tablet (800 mg total) by mouth every 8 (eight) hours as needed for moderate pain. For AFTER surgery  . Multiple Vitamin (MULTIVITAMIN) tablet Take 1 tablet by mouth daily.  Marland Kitchen omeprazole (PRILOSEC) 40 MG capsule 1 CAP(S) ONCE A DAY 20 MINUTES BEFORE BREAKFAST ORALLY 30 DAY(S)  . oxyCODONE (OXY IR/ROXICODONE) 5 MG immediate release tablet Take 1 tablet (5 mg total) by mouth every 4 (four) hours as needed for severe pain. For  AFTER surgery only, do not take and drive  . senna-docusate (SENOKOT-S) 8.6-50 MG tablet Take 2 tablets by mouth at bedtime. For AFTER surgery, do not take if having diarrhea  . [DISCONTINUED] ibuprofen (ADVIL,MOTRIN) 200 MG tablet Take 200 mg by mouth every 6 (six) hours as needed.  . [DISCONTINUED] sulfamethoxazole-trimethoprim (BACTRIM DS) 800-160 MG tablet Take 1 tablet by mouth 2 (two) times daily.   No facility-administered encounter medications on file as of 02/15/2019.   ALLERGIES:      Allergies  Allergen Reactions  . Penicillins Rash   FAMILY HISTORY:       Family History  Problem Relation Age of Onset  . Hypertension Mother   . Depression Mother   . Hypertension  Father   . Depression Father   . Depression Brother   . Hypertension Maternal Grandmother   . Breast cancer Neg Hx   . Ovarian cancer Neg Hx   . Endometrial cancer Neg Hx    SOCIAL HISTORY:      Social Connections:   . Frequency of Communication with Friends and Family: Not on file  . Frequency of Social Gatherings with Friends and Family: Not on file  . Attends Religious Services: Not on file  . Active Member of Clubs or Organizations: Not on file  . Attends Archivist Meetings: Not on file  . Marital Status: Not on file   REVIEW OF SYSTEMS:  Pertinent positives as per HPI  Denies appetite changes, fevers, chills, fatigue, unexplained weight changes.  Denies hearing loss, neck lumps or masses, mouth sores, ringing in ears or voice changes.  Denies cough or wheezing. Denies shortness of breath.  Denies chest pain or palpitations. Denies leg swelling.  Denies abdominal distention, pain, blood in stools, constipation, diarrhea, nausea, vomiting, or early satiety.  Denies pain with intercourse, dysuria, frequency, hematuria or incontinence.  Denies hot flashes, pelvic pain, or vaginal discharge.  Denies joint pain, back pain or muscle pain/cramps.  Denies itching, rash, or wounds.  Denies dizziness, headaches, numbness or seizures.  Denies swollen lymph nodes or glands, denies easy bruising or bleeding.  Denies anxiety, depression, confusion, or decreased concentration.   Physical Exam:  Vital Signs for this encounter:  Blood pressure 135/82, pulse 66, temperature 97.9 F (36.6 C), temperature source Temporal, resp. rate 16, height 5\' 5"  (1.651 m), weight 225 lb (102.1 kg), last menstrual period 02/20/2018, SpO2 100 %.  Body mass index is 37.44 kg/m.  General: Alert, oriented, no acute distress.  HEENT: Normocephalic, atraumatic. Sclera anicteric.  Chest: Clear to auscultation bilaterally.  Cardiovascular: Regular rate and rhythm, no murmurs, rubs, or gallops.   Abdomen: Obese. Normoactive bowel sounds. Soft, nondistended, nontender to palpation. No masses or hepatosplenomegaly appreciated. No palpable fluid wave.  Extremities: Grossly normal range of motion. Warm, well perfused. No edema bilaterally.  Skin: No rashes or lesions.  Lymphatics: No cervical, supraclavicular, or inguinal adenopathy.  GU: Normal external female genitalia. No lesions. No discharge or bleeding.  Bladder/urethra: No lesions or masses, well supported bladder  Vagina: Mild atrophy, no lesions or masses.  Cervix: Normal appearing, no lesions.  Uterus: Small, mobile, no parametrial involvement or nodularity.  Adnexa: No masses.  Rectal: no nodularity   LABORATORY AND RADIOLOGIC DATA:  Outside medical records were reviewed to synthesize the above history, along with the history and physical obtained during the visit.  EMB on 12/22:  CAH

## 2019-03-31 NOTE — Anesthesia Procedure Notes (Signed)
Procedure Name: Intubation Date/Time: 03/31/2019 8:54 AM Performed by: Lavina Hamman, CRNA Pre-anesthesia Checklist: Patient identified, Emergency Drugs available, Suction available, Patient being monitored and Timeout performed Patient Re-evaluated:Patient Re-evaluated prior to induction Oxygen Delivery Method: Circle system utilized Preoxygenation: Pre-oxygenation with 100% oxygen Induction Type: IV induction Ventilation: Mask ventilation without difficulty Laryngoscope Size: Mac and 3 Grade View: Grade I Tube type: Oral Tube size: 7.5 mm Number of attempts: 1 Airway Equipment and Method: Stylet Placement Confirmation: ETT inserted through vocal cords under direct vision,  positive ETCO2,  CO2 detector and breath sounds checked- equal and bilateral Secured at: 22 cm Tube secured with: Tape Dental Injury: Teeth and Oropharynx as per pre-operative assessment

## 2019-03-31 NOTE — Discharge Instructions (Signed)
03/31/2019  Return to work: 4-6 weeks if applicable  Activity: 1. Be up and out of the bed during the day.  Take a nap if needed.  You may walk up steps but be careful and use the hand rail.  Stair climbing will tire you more than you think, you may need to stop part way and rest.   2. No lifting or straining for 6 weeks.  3. No driving for 1 week(s).  Do not drive if you are taking narcotic pain medicine.  4. Shower daily.  Use soap and water on your incision and pat dry; don't rub.  No tub baths until cleared by your surgeon.   5. No sexual activity and nothing in the vagina for 8 weeks.  6. You may experience a small amount of clear drainage from your incisions, which is normal.  If the drainage persists or increases, please call the office.  7. You may experience vaginal spotting after surgery or around the 6-8 week mark from surgery when the stitches at the top of the vagina begin to dissolve.  The spotting is normal but if you experience heavy bleeding, call our office.  8. Take Tylenol or ibuprofen first for pain and only use narcotic pain medication for severe pain not relieved by the Tylenol or Ibuprofen.  Monitor your Tylenol intake to a max of 4,000 mg.  Diet: 1. Low sodium Heart Healthy Diet is recommended.  2. It is safe to use a laxative, such as Miralax or Colace, if you have difficulty moving your bowels. You can take Sennakot at bedtime every evening to keep bowel movements regular and to prevent constipation.    Wound Care: 1. Keep clean and dry.  Shower daily.  Reasons to call the Doctor:  Fever - Oral temperature greater than 100.4 degrees Fahrenheit  Foul-smelling vaginal discharge  Difficulty urinating  Nausea and vomiting  Increased pain at the site of the incision that is unrelieved with pain medicine.  Difficulty breathing with or without chest pain  New calf pain especially if only on one side  Sudden, continuing increased vaginal bleeding with or  without clots.   Contacts: For questions or concerns you should contact:  Dr. Jeral Pinch at [phone]  Joylene John, NP at [phone]  After Hours: call [phone] and have the GYN Oncologist paged/contacted

## 2019-03-31 NOTE — Progress Notes (Signed)
Dr. Berline Lopes called regarding pts discharge,okay given to discharge pt home.

## 2019-03-31 NOTE — Transfer of Care (Signed)
Immediate Anesthesia Transfer of Care Note  Patient: Donna Shepard  Procedure(s) Performed: Procedure(s) with comments: XI ROBOTIC ASSISTED TOTAL HYSTERECTOMY WITH BILATERAL SALPINGO OOPHORECTOMY (Bilateral) - BED HELD PATIENT TO BE DISCHARGED TODAY PER SCHEDULER SENTINEL NODE BIOPSY (N/A)  Patient Location: PACU  Anesthesia Type:General  Level of Consciousness:  sedated, patient cooperative and responds to stimulation  Airway & Oxygen Therapy:Patient Spontanous Breathing and Patient connected to face mask oxgen  Post-op Assessment:  Report given to PACU RN and Post -op Vital signs reviewed and stable  Post vital signs:  Reviewed and stable  Last Vitals:  Vitals:   03/31/19 0724 03/31/19 1038  BP: 128/76 (!) (P) 145/94  Pulse: 71 81  Resp: 16 (P) 18  Temp: 36.5 C (!) (P) 36.3 C  SpO2: 123456 [id number]    Complications: No apparent anesthesia complications

## 2019-04-01 ENCOUNTER — Telehealth: Payer: Self-pay

## 2019-04-01 ENCOUNTER — Inpatient Hospital Stay: Payer: BC Managed Care – PPO | Attending: Gynecologic Oncology | Admitting: Gynecologic Oncology

## 2019-04-01 ENCOUNTER — Encounter: Payer: Self-pay | Admitting: Gynecologic Oncology

## 2019-04-01 DIAGNOSIS — N8502 Endometrial intraepithelial neoplasia [EIN]: Secondary | ICD-10-CM

## 2019-04-01 DIAGNOSIS — Z90722 Acquired absence of ovaries, bilateral: Secondary | ICD-10-CM

## 2019-04-01 DIAGNOSIS — Z90710 Acquired absence of both cervix and uterus: Secondary | ICD-10-CM

## 2019-04-01 LAB — SURGICAL PATHOLOGY

## 2019-04-01 NOTE — Progress Notes (Signed)
Gynecologic Oncology Telehealth Consult Note: Gyn-Onc  I connected with [name] on 04/01/19 at 12:45PM EST by telephone and verified that I am speaking with the correct person using two identifiers.  I discussed the limitations, risks, security and privacy concerns of performing an evaluation and management service by telemedicine and the availability of in-person appointments. I also discussed with the patient that there may be a patient responsible charge related to this service. The patient expressed understanding and agreed to proceed.  Other persons participating in the visit and their role in the encounter: none.  Patient's location: home Provider's location: [facility]  Reason for Visit: Postoperative follow-up and discussion regarding pathology results  Interval History: Patient reports feeling well since surgery. She has had some intermittent abdominal discomfort and fatigue. She reports having a good appetite denies any nausea or emesis with p.o. intake. She reports no issues urinating. She endorses flatus but not yet had a bowel movement. She started taking the Senokot last night. She denies any significant vaginal bleeding.  Past Medical/Surgical History: Past Medical History:  Diagnosis Date  . Esophagitis 06/2015   after 06/2015 endoscopy   . Migraines    no aura  . Syncopal episodes    situational    Past Surgical History:  Procedure Laterality Date  . COLONOSCOPY  2017    Family History  Problem Relation Age of Onset  . Hypertension Mother   . Depression Mother   . Hypertension Father   . Depression Father   . Depression Brother   . Hypertension Maternal Grandmother   . Breast cancer Neg Hx   . Ovarian cancer Neg Hx   . Endometrial cancer Neg Hx     Social History   Socioeconomic History  . Marital status: Married    Spouse name: Not on file  . Number of children: Not on file  . Years of education: Not on file  . Highest education level: Not on  file  Occupational History  . Not on file  Tobacco Use  . Smoking status: Never Smoker  . Smokeless tobacco: Never Used  Substance and Sexual Activity  . Alcohol use: Yes    Alcohol/week: 1.0 standard drinks    Types: 1 Glasses of wine per week  . Drug use: No  . Sexual activity: Yes    Partners: Male    Comment: vasectomy  Other Topics Concern  . Not on file  Social History Narrative  . Not on file   Social Determinants of Health   Financial Resource Strain:   . Difficulty of Paying Living Expenses: Not on file  Food Insecurity:   . Worried About Charity fundraiser in the Last Year: Not on file  . Ran Out of Food in the Last Year: Not on file  Transportation Needs:   . Lack of Transportation (Medical): Not on file  . Lack of Transportation (Non-Medical): Not on file  Physical Activity:   . Days of Exercise per Week: Not on file  . Minutes of Exercise per Session: Not on file  Stress:   . Feeling of Stress : Not on file  Social Connections:   . Frequency of Communication with Friends and Family: Not on file  . Frequency of Social Gatherings with Friends and Family: Not on file  . Attends Religious Services: Not on file  . Active Member of Clubs or Organizations: Not on file  . Attends Archivist Meetings: Not on file  . Marital Status: Not on  file    Current Medications:  Current Outpatient Medications:  .  ibuprofen (ADVIL) 800 MG tablet, Take 1 tablet (800 mg total) by mouth every 8 (eight) hours as needed for moderate pain. For AFTER surgery, Disp: 30 tablet, Rfl: 1 .  omeprazole (PRILOSEC) 40 MG capsule, Take 40 mg by mouth daily before breakfast. , Disp: , Rfl: 12 .  oxyCODONE (OXY IR/ROXICODONE) 5 MG immediate release tablet, Take 1 tablet (5 mg total) by mouth every 4 (four) hours as needed for severe pain. For AFTER surgery only, do not take and drive, Disp: 10 tablet, Rfl: 0 .  senna-docusate (SENOKOT-S) 8.6-50 MG tablet, Take 2 tablets by mouth at  bedtime. For AFTER surgery, do not take if having diarrhea, Disp: 60 tablet, Rfl: 1 .  Vitamin D, Ergocalciferol, (DRISDOL) 1.25 MG (50000 UT) CAPS capsule, Take 1 capsule (50,000 Units total) by mouth every 7 (seven) days. (Patient taking differently: Take 50,000 Units by mouth every Monday. ), Disp: 12 capsule, Rfl: 4  Review of Symptoms: Pertinent positives as per HPI. Otherwise review of systems negative. Physical Exam: LMP 02/20/2018 (Approximate)  Deferred given limitations of phone visit.  Laboratory & Radiologic Studies: FINAL MICROSCOPIC DIAGNOSIS:   A. LYMPH NODE, SENTINEL, RIGHT OBTURATOR, BIOPSY:  - One of one lymph nodes negative for carcinoma (0/1).   B. LYMPH NODE, SENTINEL, RIGHT EXTERNAL ILIAC, BIOPSY:  - One of one lymph nodes negative for carcinoma (0/1).   C. LYMPH NODE, SENTINEL, LEFT OBRTURATOR, BIOPSY:  - One of one lymph nodes negative for carcinoma (0/1).   D. UTERUS, CERVIX, BILATERAL FALLOPIAN TUBES AND OVARIES, HYSTERECTOMY  WITH SALPINGO-OOPHORECTOMY:  - Uterus:    Endometrium: Focal complex atypical hyperplasia. No adenocarcinoma  identified.    Myometrium: Leiomyomata. No malignancy.    Serosa: Unremarkable. No malignancy.  - Cervix: Benign squamous and endocervical mucosa. No dysplasia or  malignancy.  - Bilateral ovaries: Inclusion cyst. No malignancy.  - Bilateral fallopian tubes: Paratubal cysts. No malignancy.   Assessment & Plan: Donna Shepard is a 54 y.o. 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  The patient is overall doing very well and meeting postoperative milestones. We discussed continued activity restrictions and I answered questions that she has about postoperative care. Also reviewed her pathology. Patient was very happy to hear these results. We discussed that this means surgery was definitive treatment and that she is considered cured.  I discussed the assessment and treatment plan  with the patient. The patient was provided with an opportunity to ask questions and all were answered. The patient agreed with the plan and demonstrated an understanding of the instructions.   The patient was advised to call back or see an in-person evaluation if the symptoms worsen or if the condition fails to improve as anticipated.   12 minutes of total time was spent for this patient encounter, including preparation, face-to-face counseling with the patient and coordination of care, and documentation of the encounter.   Jeral Pinch, MD  Division of Gynecologic Oncology  Department of Obstetrics and Gynecology  [facility]

## 2019-04-01 NOTE — Telephone Encounter (Signed)
Patient states that she is eating, drinking and urinating well. Pain is controlled by Tylenol and Ibuprofen. She is also using a heating pad as needed for pain. Pain level today is 2/10. No temp noted. Incisions are dry and intact. Patient notes that she is passing gas today but has not had BM yet. She took Senna-S last night.  Suggested to patient to take Senna-S BID.  If no BM by tomorrow mid day,  recommended to start Miralax 1 capful BID tomorrow.  Patient aware of Post-Op appointments. Phone number [phone]) given to patient to call back if she notices any change in condition.

## 2019-04-08 ENCOUNTER — Ambulatory Visit: Payer: BC Managed Care – PPO | Admitting: Gynecologic Oncology

## 2019-04-16 ENCOUNTER — Ambulatory Visit: Payer: BC Managed Care – PPO | Attending: Internal Medicine

## 2019-04-16 DIAGNOSIS — Z23 Encounter for immunization: Secondary | ICD-10-CM | POA: Insufficient documentation

## 2019-04-16 NOTE — Progress Notes (Signed)
   Covid-19 Vaccination Clinic  Name:  Donna Shepard    MRN: [id number] DOB: 11-28-1965  04/16/2019  [name] was observed post Covid-19 immunization for 15 minutes without incident. She was provided with Vaccine Information Sheet and instruction to access the V-Safe system.   [name] was instructed to call 911 with any severe reactions post vaccine: Marland Kitchen Difficulty breathing  . Swelling of face and throat  . A fast heartbeat  . A bad rash all over body  . Dizziness and weakness

## 2019-04-18 NOTE — Progress Notes (Signed)
Gynecologic Oncology Return Clinic Visit  3/10  Reason for Visit: post-op follow-up  Treatment History: EMB on 12/22:  CAH TRH/BSO, SLN biopsy on 2/27: focal CAH, no malignancy.  Interval History: Patient reports overall doing well.  She has been off ibuprofen and Tylenol since last Friday when she got her first Covid vaccination.  She endorses having normal bowel and bladder function.  She has had some very light spotting that started about a week after surgery.  She denies any pelvic pain or vaginal discharge.  She had limited diarrhea this past Saturday that she thinks was due to something she ate.  She denies any fevers or chills.  Past Medical/Surgical History: Past Medical History:  Diagnosis Date  . Esophagitis 06/2015   after 06/2015 endoscopy   . Migraines    no aura  . Syncopal episodes    situational    Past Surgical History:  Procedure Laterality Date  . COLONOSCOPY  2017  . ROBOTIC ASSISTED TOTAL HYSTERECTOMY WITH BILATERAL SALPINGO OOPHERECTOMY Bilateral 03/31/2019   Procedure: XI ROBOTIC ASSISTED TOTAL HYSTERECTOMY WITH BILATERAL SALPINGO OOPHORECTOMY;  Surgeon: Lafonda Mosses, MD;  Location: [facility];  Service: Gynecology;  Laterality: Bilateral;  BED HELD PATIENT TO BE DISCHARGED TODAY PER SCHEDULER  . SENTINEL NODE BIOPSY N/A 03/31/2019   Procedure: SENTINEL NODE BIOPSY;  Surgeon: Lafonda Mosses, MD;  Location: [facility];  Service: Gynecology;  Laterality: N/A;    Family History  Problem Relation Age of Onset  . Hypertension Mother   . Depression Mother   . Hypertension Father   . Depression Father   . Depression Brother   . Hypertension Maternal Grandmother   . Breast cancer Neg Hx   . Ovarian cancer Neg Hx   . Endometrial cancer Neg Hx     Social History   Socioeconomic History  . Marital status: Married    Spouse name: Not on file  . Number of children: Not on file  . Years of education: Not on file   . Highest education level: Not on file  Occupational History  . Not on file  Tobacco Use  . Smoking status: Never Smoker  . Smokeless tobacco: Never Used  Substance and Sexual Activity  . Alcohol use: Yes    Alcohol/week: 1.0 standard drinks    Types: 1 Glasses of wine per week  . Drug use: No  . Sexual activity: Yes    Partners: Male    Comment: vasectomy  Other Topics Concern  . Not on file  Social History Narrative  . Not on file   Social Determinants of Health   Financial Resource Strain:   . Difficulty of Paying Living Expenses: Not on file  Food Insecurity:   . Worried About Charity fundraiser in the Last Year: Not on file  . Ran Out of Food in the Last Year: Not on file  Transportation Needs:   . Lack of Transportation (Medical): Not on file  . Lack of Transportation (Non-Medical): Not on file  Physical Activity:   . Days of Exercise per Week: Not on file  . Minutes of Exercise per Session: Not on file  Stress:   . Feeling of Stress : Not on file  Social Connections:   . Frequency of Communication with Friends and Family: Not on file  . Frequency of Social Gatherings with Friends and Family: Not on file  . Attends Religious Services: Not on file  . Active Member of Clubs  or Organizations: Not on file  . Attends Archivist Meetings: Not on file  . Marital Status: Not on file    Current Medications:  Current Outpatient Medications:  .  omeprazole (PRILOSEC) 40 MG capsule, Take 40 mg by mouth daily before breakfast. , Disp: , Rfl: 12 .  Vitamin D, Ergocalciferol, (DRISDOL) 1.25 MG (50000 UT) CAPS capsule, Take 1 capsule (50,000 Units total) by mouth every 7 (seven) days. (Patient taking differently: Take 50,000 Units by mouth every Monday. ), Disp: 12 capsule, Rfl: 4  Review of Systems: + hot flashes, vaginal spotting Denies appetite changes, fevers, chills, fatigue, unexplained weight changes. Denies hearing loss, neck lumps or masses, mouth  sores, ringing in ears or voice changes. Denies cough or wheezing.  Denies shortness of breath. Denies chest pain or palpitations. Denies leg swelling. Denies abdominal distention, pain, blood in stools, constipation, diarrhea, nausea, vomiting, or early satiety. Denies pain with intercourse, dysuria, frequency, hematuria or incontinence. Denies pelvic pain or vaginal discharge.   Denies joint pain, back pain or muscle pain/cramps. Denies itching, rash, or wounds. Denies dizziness, headaches, numbness or seizures. Denies swollen lymph nodes or glands, denies easy bruising or bleeding. Denies anxiety, depression, confusion, or decreased concentration.  Physical Exam: BP 115/78 (BP Location: Left Arm, Patient Position: Sitting)   Pulse 80   Temp 98.2 F (36.8 C) (Temporal)   Resp 18   Ht 5\' 5"  (1.651 m)   Wt 222 lb (100.7 kg)   LMP 02/20/2018 (Approximate)   SpO2 100%   BMI 36.94 kg/m  General: Alert, oriented, no acute distress. HEENT: Atraumatic, normocephalic, sclera anicteric. Chest: Unlabored breathing on room air. Abdomen: Obese, soft, nontender.  Normoactive bowel sounds.  No masses or hepatosplenomegaly appreciated.  5 incisions healing well. Extremities: Grossly normal range of motion.  Warm, well perfused.  No edema bilaterally. GU: Normal appearing external genitalia without erythema, excoriation, or lesions.  Speculum exam reveals cuff intact, minimal bloody discharge noted at apex of the vagina.  No obvious granulation tissue.  Bimanual exam reveals cuff intact, no tenderness or fluctuance noted with palpation along the cuff.  Laboratory & Radiologic Studies: None new  Assessment & Plan: Sabine Tuss is a 54 y.o. 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  The patient is overall doing very well and meeting postoperative milestones.  Discussed that her vaginal spotting is likely due to dissolving sutures.  I have asked her to call  me if her bleeding does not resolve in the next couple of weeks.  We reviewed her pathology again and I answered all of her questions related to the pathology.  The patient has had an increase in hot flashes and menopausal symptoms since surgery.  At this time she does not feel so bothered that she is ready to go back on the over-the-counter [place].  She is going to give things another couple of weeks and make a decision at that time about restarting the medication.  She thinks that she may have had some weight loss when she came off of it before surgery.  15 minutes of total time was spent for this patient encounter, including preparation, face-to-face counseling with the patient and coordination of care, and documentation of the encounter.  Jeral Pinch, MD  Division of Gynecologic Oncology  Department of Obstetrics and Gynecology  [facility]

## 2019-04-21 ENCOUNTER — Other Ambulatory Visit: Payer: Self-pay

## 2019-04-21 ENCOUNTER — Encounter: Payer: Self-pay | Admitting: Gynecologic Oncology

## 2019-04-21 ENCOUNTER — Inpatient Hospital Stay: Payer: BC Managed Care – PPO | Attending: Gynecologic Oncology | Admitting: Gynecologic Oncology

## 2019-04-21 VITALS — BP 115/78 | HR 80 | Temp 98.2°F | Resp 18 | Ht 65.0 in | Wt 222.0 lb

## 2019-04-21 DIAGNOSIS — N8502 Endometrial intraepithelial neoplasia [EIN]: Secondary | ICD-10-CM

## 2019-04-21 NOTE — Patient Instructions (Signed)
You are doing well and healing well from surgery.  Remember no heavy lifting for the next several weeks and nothing in the vagina until you are 8 weeks out from surgery.  The vaginal spotting you have had should resolve.  If it does not or you develop heavier bleeding, please call to see me.

## 2019-04-23 ENCOUNTER — Ambulatory Visit: Payer: BC Managed Care – PPO

## 2019-05-18 ENCOUNTER — Ambulatory Visit: Payer: BC Managed Care – PPO | Attending: Internal Medicine

## 2019-05-18 DIAGNOSIS — Z23 Encounter for immunization: Secondary | ICD-10-CM

## 2019-05-18 NOTE — Progress Notes (Signed)
   Covid-19 Vaccination Clinic  Name:  Donna Shepard    MRN: [id number] DOB: October 26, 1965  05/18/2019  [name] was observed post Covid-19 immunization for 15 minutes without incident. She was provided with Vaccine Information Sheet and instruction to access the V-Safe system.   [name] was instructed to call 911 with any severe reactions post vaccine: Marland Kitchen Difficulty breathing  . Swelling of face and throat  . A fast heartbeat  . A bad rash all over body  . Dizziness and weakness   Immunizations Administered    Name Date Dose VIS Date Route   Pfizer COVID-19 Vaccine 05/18/2019  8:25 AM 0.3 mL 01/22/2019 Intramuscular   Manufacturer: [employer], [employer]   Lot: [id number]   [place]: [id number]

## 2019-05-30 ENCOUNTER — Other Ambulatory Visit: Payer: Self-pay | Admitting: Obstetrics & Gynecology

## 2019-06-01 NOTE — Telephone Encounter (Signed)
Medication refill request: Vitamin D 50,000  Last AEX:  03/23/18  Next AEX: None scheduled  Last MMG (if hormonal medication request): 03/10/19 Bi-rads 1 neg  Refill authorized: #[id number] pended. Please advise.

## 2019-06-02 NOTE — Telephone Encounter (Signed)
Pt has hysterectomy with gyn on due to complex endometrial hyperplasia with atypia and has post op follow up in 04/2019.  Doesn't need exam right now but should have Vit D level checked.  Please call and have her come for this if she is ok having that done right now.  Rx declined for now.

## 2019-06-05 DIAGNOSIS — Z20828 Contact with and (suspected) exposure to other viral communicable diseases: Secondary | ICD-10-CM | POA: Diagnosis not present

## 2019-06-05 DIAGNOSIS — Z03818 Encounter for observation for suspected exposure to other biological agents ruled out: Secondary | ICD-10-CM | POA: Diagnosis not present

## 2019-06-16 ENCOUNTER — Other Ambulatory Visit: Payer: Self-pay

## 2019-06-16 DIAGNOSIS — E559 Vitamin D deficiency, unspecified: Secondary | ICD-10-CM

## 2019-06-25 ENCOUNTER — Other Ambulatory Visit: Payer: Self-pay

## 2019-06-25 ENCOUNTER — Other Ambulatory Visit (INDEPENDENT_AMBULATORY_CARE_PROVIDER_SITE_OTHER): Payer: BC Managed Care – PPO

## 2019-06-25 DIAGNOSIS — E559 Vitamin D deficiency, unspecified: Secondary | ICD-10-CM | POA: Diagnosis not present

## 2019-06-26 LAB — VITAMIN D 25 HYDROXY (VIT D DEFICIENCY, FRACTURES): Vit D, 25-Hydroxy: 45.4 ng/mL (ref 30.0–100.0)

## 2019-06-28 ENCOUNTER — Other Ambulatory Visit: Payer: Self-pay | Admitting: Obstetrics & Gynecology

## 2019-06-28 MED ORDER — VITAMIN D (ERGOCALCIFEROL) 1.25 MG (50000 UNIT) PO CAPS: 50000.0000 [IU] | ORAL_CAPSULE | ORAL | 4 refills | Status: DC

## 2019-07-02 DIAGNOSIS — Z20822 Contact with and (suspected) exposure to covid-19: Secondary | ICD-10-CM | POA: Diagnosis not present

## 2019-07-04 DIAGNOSIS — Z20828 Contact with and (suspected) exposure to other viral communicable diseases: Secondary | ICD-10-CM | POA: Diagnosis not present

## 2019-07-04 DIAGNOSIS — Z03818 Encounter for observation for suspected exposure to other biological agents ruled out: Secondary | ICD-10-CM | POA: Diagnosis not present

## 2019-07-23 DIAGNOSIS — Z20822 Contact with and (suspected) exposure to covid-19: Secondary | ICD-10-CM | POA: Diagnosis not present

## 2019-07-23 DIAGNOSIS — Z03818 Encounter for observation for suspected exposure to other biological agents ruled out: Secondary | ICD-10-CM | POA: Diagnosis not present

## 2019-08-13 DIAGNOSIS — Z03818 Encounter for observation for suspected exposure to other biological agents ruled out: Secondary | ICD-10-CM | POA: Diagnosis not present

## 2019-08-13 DIAGNOSIS — Z20822 Contact with and (suspected) exposure to covid-19: Secondary | ICD-10-CM | POA: Diagnosis not present

## 2019-09-28 DIAGNOSIS — Z20828 Contact with and (suspected) exposure to other viral communicable diseases: Secondary | ICD-10-CM | POA: Diagnosis not present

## 2019-12-25 ENCOUNTER — Ambulatory Visit: Payer: BC Managed Care – PPO

## 2019-12-31 ENCOUNTER — Ambulatory Visit: Payer: BC Managed Care – PPO | Attending: Internal Medicine

## 2019-12-31 DIAGNOSIS — Z23 Encounter for immunization: Secondary | ICD-10-CM

## 2019-12-31 NOTE — Progress Notes (Signed)
   Covid-19 Vaccination Clinic  Name:  Donna Shepard    MRN: [id number] DOB: Jul 08, 1965  12/31/2019  [name] was observed post Covid-19 immunization for 15 minutes without incident. She was provided with Vaccine Information Sheet and instruction to access the V-Safe system.   [name] was instructed to call 911 with any severe reactions post vaccine: Marland Kitchen Difficulty breathing  . Swelling of face and throat  . A fast heartbeat  . A bad rash all over body  . Dizziness and weakness   Immunizations Administered    Name Date Dose VIS Date Route   Pfizer COVID-19 Vaccine 12/31/2019  2:27 PM 0.3 mL 12/01/2019 Intramuscular   Manufacturer: [place]   Lot: [id number]   [place]: [id number]

## 2020-02-12 DIAGNOSIS — Z03818 Encounter for observation for suspected exposure to other biological agents ruled out: Secondary | ICD-10-CM | POA: Diagnosis not present

## 2020-02-12 DIAGNOSIS — U071 COVID-19: Secondary | ICD-10-CM | POA: Diagnosis not present

## 2020-02-15 ENCOUNTER — Other Ambulatory Visit: Payer: BC Managed Care – PPO

## 2020-02-18 ENCOUNTER — Other Ambulatory Visit: Payer: BC Managed Care – PPO

## 2020-02-18 ENCOUNTER — Other Ambulatory Visit: Payer: Self-pay

## 2020-02-18 DIAGNOSIS — Z20822 Contact with and (suspected) exposure to covid-19: Secondary | ICD-10-CM

## 2020-02-21 DIAGNOSIS — U071 COVID-19: Secondary | ICD-10-CM | POA: Diagnosis not present

## 2020-02-21 LAB — NOVEL CORONAVIRUS, NAA: SARS-CoV-2, NAA: DETECTED — AB

## 2020-03-12 DIAGNOSIS — N390 Urinary tract infection, site not specified: Secondary | ICD-10-CM | POA: Diagnosis not present

## 2020-03-12 DIAGNOSIS — R319 Hematuria, unspecified: Secondary | ICD-10-CM | POA: Diagnosis not present

## 2020-03-12 DIAGNOSIS — R350 Frequency of micturition: Secondary | ICD-10-CM | POA: Diagnosis not present

## 2020-07-12 ENCOUNTER — Other Ambulatory Visit: Payer: Self-pay | Admitting: Physical Medicine and Rehabilitation

## 2020-07-12 DIAGNOSIS — Z1231 Encounter for screening mammogram for malignant neoplasm of breast: Secondary | ICD-10-CM

## 2020-07-14 ENCOUNTER — Ambulatory Visit
Admission: RE | Admit: 2020-07-14 | Discharge: 2020-07-14 | Disposition: A | Discharge status: Home / Self Care | Payer: BC Managed Care – PPO | Source: Ambulatory Visit | Attending: Physical Medicine and Rehabilitation | Admitting: Physical Medicine and Rehabilitation

## 2020-07-14 ENCOUNTER — Other Ambulatory Visit: Payer: Self-pay

## 2020-07-14 DIAGNOSIS — Z1231 Encounter for screening mammogram for malignant neoplasm of breast: Secondary | ICD-10-CM | POA: Diagnosis not present

## 2020-07-21 ENCOUNTER — Other Ambulatory Visit (HOSPITAL_BASED_OUTPATIENT_CLINIC_OR_DEPARTMENT_OTHER): Payer: Self-pay

## 2020-07-21 ENCOUNTER — Other Ambulatory Visit: Payer: Self-pay

## 2020-07-21 ENCOUNTER — Ambulatory Visit: Payer: BC Managed Care – PPO | Attending: Internal Medicine

## 2020-07-21 DIAGNOSIS — Z23 Encounter for immunization: Secondary | ICD-10-CM

## 2020-07-21 MED ORDER — COVID-19 MRNA VACC (MODERNA) 100 MCG/0.5ML IM SUSP
INTRAMUSCULAR | 0 refills | Status: DC
  Filled 2020-07-21: qty 0.25, 1d supply, fill #0

## 2020-07-21 NOTE — Progress Notes (Signed)
   Covid-19 Vaccination Clinic  Name:  Laia Wiley    MRN: [id number] DOB: [birth year]  07/21/2020  Ms. Boudoin was observed post Covid-19 immunization for 15 minutes without incident. She was provided with Vaccine Information Sheet and instruction to access the V-Safe system.   Ms. Waller was instructed to call 911 with any severe reactions post vaccine: Difficulty breathing  Swelling of face and throat  A fast heartbeat  A bad rash all over body  Dizziness and weakness   Immunizations Administered     Name Date Dose VIS Date Route   Moderna Covid-19 Booster Vaccine 07/21/2020 12:52 PM 0.25 mL 12/01/2019 Intramuscular   Manufacturer: Moderna   Lot: [id number]   [place]: [id number]

## 2020-08-09 ENCOUNTER — Ambulatory Visit (HOSPITAL_BASED_OUTPATIENT_CLINIC_OR_DEPARTMENT_OTHER): Payer: BC Managed Care – PPO | Admitting: Obstetrics & Gynecology

## 2020-09-13 ENCOUNTER — Other Ambulatory Visit: Payer: Self-pay | Admitting: Obstetrics & Gynecology

## 2020-09-13 NOTE — Telephone Encounter (Signed)
LMOVM for pt to call office regarding refill request 

## 2020-09-14 NOTE — Telephone Encounter (Signed)
Patient called back. She states she did not request this medication, however she is probably due for a refill. She states that she has an appointment in a couple of weeks with Dr. Sabra Heck and would prefer to wait until then to have medication refilled. She did mention that she has some on hand, but has not been taking it like she should. tbw

## 2020-09-14 NOTE — Telephone Encounter (Signed)
LMOM at 2:33 for patient to call office about Rx refill. tbw

## 2020-09-27 ENCOUNTER — Ambulatory Visit (INDEPENDENT_AMBULATORY_CARE_PROVIDER_SITE_OTHER): Payer: BC Managed Care – PPO | Admitting: Obstetrics & Gynecology

## 2020-09-27 ENCOUNTER — Encounter (HOSPITAL_BASED_OUTPATIENT_CLINIC_OR_DEPARTMENT_OTHER): Payer: Self-pay | Admitting: Obstetrics & Gynecology

## 2020-09-27 ENCOUNTER — Other Ambulatory Visit: Payer: Self-pay

## 2020-09-27 VITALS — BP 134/83 | HR 67 | Ht 64.5 in | Wt 238.0 lb

## 2020-09-27 DIAGNOSIS — F320 Major depressive disorder, single episode, mild: Secondary | ICD-10-CM | POA: Diagnosis not present

## 2020-09-27 DIAGNOSIS — F32A Depression, unspecified: Secondary | ICD-10-CM

## 2020-09-27 DIAGNOSIS — E559 Vitamin D deficiency, unspecified: Secondary | ICD-10-CM

## 2020-09-27 DIAGNOSIS — N8502 Endometrial intraepithelial neoplasia [EIN]: Secondary | ICD-10-CM

## 2020-09-27 DIAGNOSIS — Z01419 Encounter for gynecological examination (general) (routine) without abnormal findings: Secondary | ICD-10-CM

## 2020-09-27 DIAGNOSIS — Z0000 Encounter for general adult medical examination without abnormal findings: Secondary | ICD-10-CM

## 2020-09-27 NOTE — Progress Notes (Signed)
55 y.o. G65P2002 Married White or Caucasian female here for annual exam.  Doing well.  Denies vaginal bleeding.  Has robotic hysterectomy/BSO/lymph node dissection 03/2019.  Pathology reviewed. Pt understands does not need future pap smears.  Father passed away in 06/24/2022.  Has been experiencing sadness and has experienced work stressors.  Resigned job this week.  Has been seeing a priest for some counseling.  Tearful about how job ended.  Depression screening with two questions positive for PHQ-9 was performed.  Patient's last menstrual period was 02/20/2018 (approximate).          Sexually active: Yes.    The current method of family planning is vasectomy.    Exercising: Yes.     Smoker:  no  Health Maintenance: Pap:  03/23/2018 Negative History of abnormal Pap:  no MMG:  07/14/2020 Negative Colonoscopy:  06/2015 follow up 5 years.  Pt aware this is due. BMD:  not indicated TDaP:  2013 Shingrix:   discussed Hep C testing: will draw with blood work Screening Labs: needs to have done   reports that she has never smoked. She has never used smokeless tobacco. She reports current alcohol use of about 1.0 standard drink per week. She reports that she does not use drugs.  Past Medical History:  Diagnosis Date   Complex atypical endometrial hyperplasia 01/2019   s/p TLH/BSO, sentinel node biopsy   Esophagitis 06/2015   after 06/2015 endoscopy    Migraines    no aura   Syncopal episodes    situational    Past Surgical History:  Procedure Laterality Date   COLONOSCOPY  2017   ROBOTIC ASSISTED TOTAL HYSTERECTOMY WITH BILATERAL SALPINGO OOPHERECTOMY Bilateral 03/31/2019   Procedure: XI ROBOTIC ASSISTED TOTAL HYSTERECTOMY WITH BILATERAL SALPINGO OOPHORECTOMY;  Surgeon: Lafonda Mosses, MD;  Location: [facility];  Service: Gynecology;  Laterality: Bilateral;  BED HELD PATIENT TO BE DISCHARGED TODAY PER SCHEDULER   SENTINEL NODE BIOPSY N/A 03/31/2019   Procedure: SENTINEL NODE  BIOPSY;  Surgeon: Lafonda Mosses, MD;  Location: [facility];  Service: Gynecology;  Laterality: N/A;    Current Outpatient Medications  Medication Sig Dispense Refill   omeprazole (PRILOSEC) 40 MG capsule Take 40 mg by mouth daily before breakfast.   12   No current facility-administered medications for this visit.    Family History  Problem Relation Age of Onset   Hypertension Mother    Depression Mother    Hypertension Father    Depression Father    Depression Brother    Hypertension Maternal Grandmother    Breast cancer Neg Hx    Ovarian cancer Neg Hx    Endometrial cancer Neg Hx     Review of Systems  Psychiatric/Behavioral:  Positive for dysphoric mood.   All other systems reviewed and are negative.  Exam:   BP 134/83 (BP Location: Right Arm, Patient Position: Sitting, Cuff Size: Large)   Pulse 67   Ht 5' 4.5" (1.638 m)   Wt 238 lb (108 kg)   LMP 02/20/2018 (Approximate)   BMI 40.22 kg/m   Height: 5' 4.5" (163.8 cm)  [place] Visit from 09/27/2020 in [place]  PHQ-9 Total Score 8      General appearance: alert, cooperative and appears stated age Head: Normocephalic, without obvious abnormality, atraumatic Neck: no adenopathy, supple, symmetrical, trachea midline and thyroid normal to inspection and palpation Lungs: clear to auscultation bilaterally Breasts: normal appearance, no masses or tenderness Heart: regular rate  and rhythm Abdomen: soft, non-tender; bowel sounds normal; no masses,  no organomegaly Extremities: extremities normal, atraumatic, no cyanosis or edema Skin: Skin color, texture, turgor normal. No rashes or lesions Lymph nodes: Cervical, supraclavicular, and axillary nodes normal. No abnormal inguinal nodes palpated Neurologic: Grossly normal  Pelvic: External genitalia:  no lesions              Urethra:  normal appearing urethra with no masses, tenderness or lesions               Bartholins and Skenes: normal                 Vagina: normal appearing vagina with normal color and no discharge, no lesions              Cervix: absent              Pap taken: No. Bimanual Exam:  Uterus:  uterus absent              Adnexa: no mass, fullness, tenderness               Rectovaginal: Confirms               Anus:  normal sphincter tone, no lesions  Chaperone, Octaviano Batty, CMA, was present for exam.  Assessment/Plan: 1. Well woman exam with routine gynecological exam - pap not indicated - MMG 07/2020 - pt aware colonoscopy is due - labs ordered - vaccines updated - PCP options discussed  2. Complex atypical endometrial hyperplasia - s/p TLH/BSO/LND 03/2019  3. Vitamin D deficiency - on rx  4. Mild depression (HCC) - PHQ score 8 today - treatment discussed.  She is going to consider  5. Serum calcium elevated - has seen Dr. Cruzita Lederer in consultation  38 minutes total spent with pt today.  About 15 minutes spent in specific discussion of mood, doing PHQ, discussing mild depression, medication treatment options and therapy.  This was in addition to routine gynecological exam, history and physical.

## 2020-09-28 ENCOUNTER — Other Ambulatory Visit (HOSPITAL_BASED_OUTPATIENT_CLINIC_OR_DEPARTMENT_OTHER): Payer: Self-pay | Admitting: Obstetrics & Gynecology

## 2020-09-28 ENCOUNTER — Ambulatory Visit (HOSPITAL_BASED_OUTPATIENT_CLINIC_OR_DEPARTMENT_OTHER): Payer: BC Managed Care – PPO | Admitting: Obstetrics & Gynecology

## 2020-09-28 ENCOUNTER — Other Ambulatory Visit (HOSPITAL_BASED_OUTPATIENT_CLINIC_OR_DEPARTMENT_OTHER)
Admission: RE | Admit: 2020-09-28 | Discharge: 2020-09-28 | Disposition: A | Discharge status: Home / Self Care | Payer: BC Managed Care – PPO | Source: Ambulatory Visit | Attending: Obstetrics & Gynecology | Admitting: Obstetrics & Gynecology

## 2020-09-28 DIAGNOSIS — E559 Vitamin D deficiency, unspecified: Secondary | ICD-10-CM | POA: Insufficient documentation

## 2020-09-28 DIAGNOSIS — F320 Major depressive disorder, single episode, mild: Secondary | ICD-10-CM | POA: Diagnosis not present

## 2020-09-28 DIAGNOSIS — Z0000 Encounter for general adult medical examination without abnormal findings: Secondary | ICD-10-CM | POA: Diagnosis not present

## 2020-09-28 LAB — COMPREHENSIVE METABOLIC PANEL
ALT: 25 U/L (ref 0–44)
AST: 16 U/L (ref 15–41)
Albumin: 3.8 g/dL (ref 3.5–5.0)
Alkaline Phosphatase: 94 U/L (ref 38–126)
Anion gap: 6 (ref 5–15)
BUN: 14 mg/dL (ref 6–20)
CO2: 29 mmol/L (ref 22–32)
Calcium: 9.1 mg/dL (ref 8.9–10.3)
Chloride: 104 mmol/L (ref 98–111)
Creatinine, Ser: 0.83 mg/dL (ref 0.44–1.00)
GFR, Estimated: >60 mL/min (ref >60)
Glucose, Bld: 88 mg/dL (ref 70–99)
Potassium: 4 mmol/L (ref 3.5–5.1)
Sodium: 139 mmol/L (ref 135–145)
Total Bilirubin: 0.5 mg/dL (ref 0.3–1.2)
Total Protein: 6.6 g/dL (ref 6.5–8.1)

## 2020-09-28 LAB — CBC
HCT: 39.2 % (ref 36.0–46.0)
Hemoglobin: 12.6 g/dL (ref 12.0–15.0)
MCH: 27.6 pg (ref 26.0–34.0)
MCHC: 32.1 g/dL (ref 30.0–36.0)
MCV: 85.8 fL (ref 80.0–100.0)
Platelets: 320 10*3/uL (ref 150–400)
RBC: 4.57 MIL/uL (ref 3.87–5.11)
RDW: 13.8 % (ref 11.5–15.5)
WBC: 6.6 10*3/uL (ref 4.0–10.5)
nRBC: 0 % (ref 0.0–0.2)

## 2020-09-28 LAB — HEMOGLOBIN A1C
Hgb A1c MFr Bld: 5.5 % (ref 4.8–5.6)
Mean Plasma Glucose: 111.15 mg/dL

## 2020-09-28 LAB — LIPID PANEL
Cholesterol: 177 mg/dL (ref 0–200)
HDL: 66 mg/dL (ref >40)
LDL Cholesterol: 96 mg/dL (ref 0–99)
Total CHOL/HDL Ratio: 2.7 RATIO
Triglycerides: 74 mg/dL (ref <150)
VLDL: 15 mg/dL (ref 0–40)

## 2020-09-28 LAB — VITAMIN D 25 HYDROXY (VIT D DEFICIENCY, FRACTURES): Vit D, 25-Hydroxy: 41.02 ng/mL (ref 30–100)

## 2020-09-28 LAB — TSH: TSH: 2.577 u[IU]/mL (ref 0.350–4.500)

## 2020-09-29 ENCOUNTER — Encounter (HOSPITAL_BASED_OUTPATIENT_CLINIC_OR_DEPARTMENT_OTHER): Payer: Self-pay

## 2020-09-29 DIAGNOSIS — F320 Major depressive disorder, single episode, mild: Secondary | ICD-10-CM | POA: Insufficient documentation

## 2020-09-29 DIAGNOSIS — F32A Depression, unspecified: Secondary | ICD-10-CM | POA: Insufficient documentation

## 2020-10-02 ENCOUNTER — Other Ambulatory Visit (HOSPITAL_BASED_OUTPATIENT_CLINIC_OR_DEPARTMENT_OTHER): Payer: Self-pay | Admitting: *Deleted

## 2020-10-02 MED ORDER — ESCITALOPRAM OXALATE 10 MG PO TABS: 10.0000 mg | ORAL_TABLET | Freq: Every day | ORAL | 0 refills | Status: DC

## 2020-10-18 DIAGNOSIS — L578 Other skin changes due to chronic exposure to nonionizing radiation: Secondary | ICD-10-CM | POA: Diagnosis not present

## 2020-10-18 DIAGNOSIS — L821 Other seborrheic keratosis: Secondary | ICD-10-CM | POA: Diagnosis not present

## 2020-10-18 DIAGNOSIS — L814 Other melanin hyperpigmentation: Secondary | ICD-10-CM | POA: Diagnosis not present

## 2020-10-18 DIAGNOSIS — D1801 Hemangioma of skin and subcutaneous tissue: Secondary | ICD-10-CM | POA: Diagnosis not present

## 2020-10-19 ENCOUNTER — Ambulatory Visit (HOSPITAL_BASED_OUTPATIENT_CLINIC_OR_DEPARTMENT_OTHER): Payer: BC Managed Care – PPO | Admitting: Obstetrics & Gynecology

## 2020-10-26 ENCOUNTER — Ambulatory Visit (HOSPITAL_BASED_OUTPATIENT_CLINIC_OR_DEPARTMENT_OTHER): Payer: BC Managed Care – PPO | Admitting: Nurse Practitioner

## 2020-10-27 ENCOUNTER — Ambulatory Visit (HOSPITAL_BASED_OUTPATIENT_CLINIC_OR_DEPARTMENT_OTHER): Payer: BC Managed Care – PPO | Admitting: Nurse Practitioner

## 2020-10-30 ENCOUNTER — Other Ambulatory Visit: Payer: Self-pay

## 2020-10-30 ENCOUNTER — Encounter (HOSPITAL_BASED_OUTPATIENT_CLINIC_OR_DEPARTMENT_OTHER): Payer: Self-pay | Admitting: Obstetrics & Gynecology

## 2020-10-30 ENCOUNTER — Telehealth (INDEPENDENT_AMBULATORY_CARE_PROVIDER_SITE_OTHER): Payer: BC Managed Care – PPO | Admitting: Obstetrics & Gynecology

## 2020-10-30 DIAGNOSIS — F320 Major depressive disorder, single episode, mild: Secondary | ICD-10-CM

## 2020-10-30 DIAGNOSIS — F32A Depression, unspecified: Secondary | ICD-10-CM

## 2020-10-30 MED ORDER — ESCITALOPRAM OXALATE 10 MG PO TABS: 10.0000 mg | ORAL_TABLET | Freq: Every day | ORAL | 3 refills | Status: DC

## 2020-10-30 NOTE — Progress Notes (Signed)
Virtual Visit via Video Note  I connected with Donna Shepard on 10/30/20 at 10:15 AM EDT by a video enabled telemedicine application and verified that I am speaking with the correct person using two identifiers.  Location: Patient: in [place] with family Provider: office   I discussed the limitations of evaluation and management by telemedicine and the availability of in person appointments. The patient expressed understanding and agreed to proceed.  History of Present Illness: 55 yo G2P2 MWF for discussion of lexapro being treated for mild depression.  PHQ score was 8 on 09/27/2020.  She did finish with her job.  She has traveled and been to the beach.  She does feel better.  Tearfulness has improved.  She is exercising more.  She is scheduled to see SaraBeth Early next week for new PCP care.   Observations/Objective: WNWD WF, NAD  PHQ repeated today:  2  Assessment and Plan: Mild depression, improved  Follow Up Instructions: Discussed with pt continuing medication for at least 3 months before making any changes.     I discussed the assessment and treatment plan with the patient. The patient was provided an opportunity to ask questions and all were answered. The patient agreed with the plan and demonstrated an understanding of the instructions.   The patient was advised to call back or seek an in-person evaluation if the symptoms worsen or if the condition fails to improve as anticipated.  I provided 21 minutes of non-face-to-face time during this encounter.  Megan Salon, MD

## 2020-11-06 ENCOUNTER — Ambulatory Visit (INDEPENDENT_AMBULATORY_CARE_PROVIDER_SITE_OTHER): Payer: BC Managed Care – PPO | Admitting: Nurse Practitioner

## 2020-11-06 ENCOUNTER — Other Ambulatory Visit: Payer: Self-pay

## 2020-11-06 ENCOUNTER — Encounter (HOSPITAL_BASED_OUTPATIENT_CLINIC_OR_DEPARTMENT_OTHER): Payer: Self-pay | Admitting: Nurse Practitioner

## 2020-11-06 ENCOUNTER — Other Ambulatory Visit (HOSPITAL_BASED_OUTPATIENT_CLINIC_OR_DEPARTMENT_OTHER): Payer: Self-pay

## 2020-11-06 VITALS — BP 126/65 | HR 59 | Ht 65.0 in | Wt 233.6 lb

## 2020-11-06 DIAGNOSIS — Z23 Encounter for immunization: Secondary | ICD-10-CM

## 2020-11-06 DIAGNOSIS — Z7185 Encounter for immunization safety counseling: Secondary | ICD-10-CM

## 2020-11-06 DIAGNOSIS — F32A Depression, unspecified: Secondary | ICD-10-CM

## 2020-11-06 DIAGNOSIS — K200 Eosinophilic esophagitis: Secondary | ICD-10-CM

## 2020-11-06 DIAGNOSIS — F320 Major depressive disorder, single episode, mild: Secondary | ICD-10-CM

## 2020-11-06 DIAGNOSIS — Z0000 Encounter for general adult medical examination without abnormal findings: Secondary | ICD-10-CM | POA: Insufficient documentation

## 2020-11-06 HISTORY — DX: Encounter for immunization safety counseling: Z71.85

## 2020-11-06 MED ORDER — ZOSTER VAC RECOMB ADJUVANTED 50 MCG/0.5ML IM SUSR: 0.5000 mL | Freq: Once | INTRAMUSCULAR | 1 refills | Status: AC

## 2020-11-06 MED ORDER — OMEPRAZOLE 40 MG PO CPDR: 40.0000 mg | DELAYED_RELEASE_CAPSULE | Freq: Every day | ORAL | 3 refills | Status: DC

## 2020-11-06 NOTE — Assessment & Plan Note (Addendum)
Review of current and past medical history, social history, medication, and family history.  Review of care gaps and health maintenance recommendations.  Records from recent providers to be requested if not available in Chart Review or Care Everywhere.  Recommendations for health maintenance, diet, and exercise provided.  Labs today: None- completed 09/2020 HM Recommendations: Flu vaccine today, Shingrix vaccine script given CPE due: 12 months Colonoscopy due 2024 Tetanus booster due July 2023

## 2020-11-06 NOTE — Patient Instructions (Addendum)
Recommendations from today's visit: Your next tetanus is due after July of next year  Let me know if you would like me to make changes to the omeprazole.   Information on diet, exercise, and health maintenance recommendations are listed below. This is information to help you be sure you are on track for optimal health and monitoring.   Please look over this and let us know if you have any questions or if you have completed any of the health maintenance outside of [facility] [place] so that we can be sure your records are up to date.  ___________________________________________________________  Thank you for choosing [facility] for your Primary Care needs. I am excited for the opportunity to partner with you to meet your health care goals. It was a pleasure meeting you today!  I am an Adult-Geriatric Nurse Practitioner with a background in caring for patients for more than 20 years. I provide primary care and sports medicine services to patients age 72 and older within this office. I am also the director of the APP Fellowship with [facility].   I am passionate about providing the best service to you through preventive medicine and supportive care. I consider you a part of the medical team and value your input. I work diligently to ensure that you are heard and your needs are met in a safe and effective manner. I want you to feel comfortable with me as your provider and want you to know that your health concerns are important to me.  For your information, our office hours are Monday- Friday 8:00 AM - 5:00 PM At this time I am not in the office on Wednesdays.  If you have questions or concerns, please call our office at [phone] or send [place] a MyChart message and we will respond as quickly as possible.   For all urgent or time sensitive needs we ask that you please call the office to avoid delays. MyChart is not constantly monitored and replies may take up to 72  business hours.  MyChart Policy: MyChart allows for you to see your visit notes, after visit summary, provider recommendations, lab and tests results, make an appointment, request refills, and contact your provider or the office for non-urgent questions or concerns. Providers are seeing patients during normal business hours and do not have built in time to review MyChart messages.  We ask that you allow a minimum of 4 business days for responses to [employer]. For this reason, please do not send urgent requests through [place]. Please call the office at [phone]. Complex MyChart concerns may require a visit. Your provider may request you schedule a virtual or in person visit to ensure we are providing the best care possible. MyChart messages sent after 4:00 PM on Friday will not be received by the provider until Monday morning.    Lab and Test Results: You will receive your lab and test results on MyChart as soon as they are completed and results have been sent by the lab or testing facility. Due to this service, you will receive your results BEFORE your provider.  I review lab and tests results each morning prior to seeing patients. Some results require collaboration with other providers to ensure you are receiving the most appropriate care. For this reason, we ask that you please allow a minimum of 4 business days for your provider to receive and review lab and test results and contact you about these.  Most lab and test  result comments from the provider will be sent through [place]. Your provider may recommend changes to the plan of care, follow-up visits, repeat testing, ask questions, or request an office visit to discuss these results. You may reply directly to this message or call the office at [phone] to provide information for the provider or set up an appointment. In some instances, you will be called with test results and recommendations. Please let us know if this is preferred  and we will make note of this in your chart to provide this for you.    If you have not heard a response to your lab or test results in 72 business hours, please call the office to let us know.   After Hours: For all non-emergency after hours needs, please call the office at [phone] and select the option to reach the on-call provider service. On-call services are shared between multiple [place] offices and therefore it will not be possible to speak directly with your provider. On-call providers may provide medical advice and recommendations, but are unable to provide refills for maintenance medications.  For all emergency or urgent medical needs after normal business hours, we recommend that you seek care at the closest Urgent Care or Emergency Department to ensure appropriate treatment in a timely manner.  [facility] has a 24 hour emergency room located on the ground floor for your convenience.    Please do not hesitate to reach out to [place] with concerns.   Thank you, again, for choosing me as your health care partner. I appreciate your trust and look forward to learning more about you.   Worthy Keeler, DNP, AGNP-c ___________________________________________________________  Health Maintenance Recommendations Screening Testing Mammogram Every 1 -2 years based on history and risk factors Starting at age 51 Pap Smear Ages 21-39 every 3 years Ages 71-65 every 5 years with HPV testing More frequent testing may be required based on results and history Colon Cancer Screening Every 1-10 years based on test performed, risk factors, and history Starting at age 67 Bone Density Screening Every 2-10 years based on history Starting at age 52 for women Recommendations for men differ based on medication usage, history, and risk factors AAA Screening One time ultrasound Men 66-55 years old who have every smoked Lung Cancer Screening Low Dose Lung CT every 12  months Age 14-80 years with a 30 pack-year smoking history who still smoke or who have quit within the last 15 years  Screening Labs Routine  Labs: Complete Blood Count (CBC), Complete Metabolic Panel (CMP), Cholesterol (Lipid Panel) Every 6-12 months based on history and medications May be recommended more frequently based on current conditions or previous results Hemoglobin A1c Lab Every 3-12 months based on history and previous results Starting at age 69 or earlier with diagnosis of diabetes, high cholesterol, BMI >26, and/or risk factors Frequent monitoring for patients with diabetes to ensure blood sugar control Thyroid Panel (TSH w/ T3 & T4) Every 6 months based on history, symptoms, and risk factors May be repeated more often if on medication HIV One time testing for all patients 66 and older May be repeated more frequently for patients with increased risk factors or exposure Hepatitis C One time testing for all patients 34 and older May be repeated more frequently for patients with increased risk factors or exposure Gonorrhea, Chlamydia Every 12 months for all sexually active persons 13-24 years Additional monitoring may be recommended for those who are considered high risk or who have  symptoms PSA Men 60-32 years old with risk factors Additional screening may be recommended from age 24-69 based on risk factors, symptoms, and history  Vaccine Recommendations Tetanus Booster All adults every 10 years Flu Vaccine All patients 6 months and older every year COVID Vaccine All patients 12 years and older Initial dosing with booster May recommend additional booster based on age and health history HPV Vaccine 2 doses all patients age 34-26 Dosing may be considered for patients over 26 Shingles Vaccine (Shingrix) 2 doses all adults 65 years and older Pneumonia (Pneumovax 23) All adults 52 years and older May recommend earlier dosing based on health history Pneumonia (Prevnar  36) All adults 75 years and older Dosed 1 year after Pneumovax 23  Additional Screening, Testing, and Vaccinations may be recommended on an individualized basis based on family history, health history, risk factors, and/or exposure.  __________________________________________________________  Diet Recommendations for All Patients  I recommend that all patients maintain a diet low in saturated fats, carbohydrates, and cholesterol. While this can be challenging at first, it is not impossible and small changes can make big differences.  Things to try: Decreasing the amount of soda, sweet tea, and/or juice to one or less per day and replace with water While water is always the first choice, if you do not like water you may consider adding a water additive without sugar to improve the taste other sugar free drinks Replace potatoes with a brightly colored vegetable at dinner Use healthy oils, such as canola oil or olive oil, instead of butter or hard margarine Limit your bread intake to two pieces or less a day Replace regular pasta with low carb pasta options Bake, broil, or grill foods instead of frying Monitor portion sizes  Eat smaller, more frequent meals throughout the day instead of large meals  An important thing to remember is, if you love foods that are not great for your health, you don't have to give them up completely. Instead, allow these foods to be a reward when you have done well. Allowing yourself to still have special treats every once in a while is a nice way to tell yourself thank you for working hard to keep yourself healthy.   Also remember that every day is a new day. If you have a bad day and "fall off the wagon", you can still climb right back up and keep moving along on your journey!  We have resources available to help you!  Some websites that may be helpful  include: www.http://carter.biz/  Www.VeryWellFit.com _____________________________________________________________  Activity Recommendations for All Patients  I recommend that all adults get at least 20 minutes of moderate physical activity that elevates your heart rate at least 5 days out of the week.  Some examples include: Walking or jogging at a pace that allows you to carry on a conversation Cycling (stationary bike or outdoors) Water aerobics Yoga Weight lifting Dancing If physical limitations prevent you from putting stress on your joints, exercise in a pool or seated in a chair are excellent options.  Do determine your MAXIMUM heart rate for activity: YOUR AGE - 220 = MAX HeartRate   Remember! Do not push yourself too hard.  Start slowly and build up your pace, speed, weight, time in exercise, etc.  Allow your body to rest between exercise and get good sleep. You will need more water than normal when you are exerting yourself. Do not wait until you are thirsty to drink. Drink with a purpose of getting  in at least 8, 8 ounce glasses of water a day plus more depending on how much you exercise and sweat.    If you begin to develop dizziness, chest pain, abdominal pain, jaw pain, shortness of breath, headache, vision changes, lightheadedness, or other concerning symptoms, stop the activity and allow your body to rest. If your symptoms are severe, seek emergency evaluation immediately. If your symptoms are concerning, but not severe, please let us know so that we can recommend further evaluation.   ________________________________________________________________

## 2020-11-06 NOTE — Progress Notes (Signed)
Orma Render, DNP, AGNP-c Primary Care & Sports Medicine [address]  [place], [place] [address] [phone]  New patient visit   Patient: Donna Shepard   DOB: 03-22-1965   55 y.o. Female  MRN: [id number] Visit Date: 11/06/2020  Patient Care Team: Dhriti Fales, Coralee Pesa, NP as PCP - General (Nurse Practitioner) Megan Salon, MD as Consulting Physician (Gynecology)  Today's healthcare provider: Orma Render, NP   Chief Complaint  Patient presents with   Establish Care    Patient states she recently spoke with Dr. Sabra Heck about depression.   Subjective    Charleen Madera is a 55 y.o. female who presents today as a new patient to establish care.  HPI HPI     Establish Care    Additional comments: Patient states she recently spoke with Dr. Sabra Heck about depression.      Last edited by Noel Gerold, CMA on 11/06/2020 10:38 AM.      Recently started treatment for depression with Dr. Migdalia Dk (GYN). She has started exercising regularly and has also quit her job, which was contributing quite a bit of stress and she tells me today she is feeling much better. She would like to stay on the medication through the rest of the winter, as she does have some seasonal depression symptoms historically, but may consider tapering in the spring.  She denies side effects to the medication or any concerns today.  Last colonoscopy 2017 with Dr. Collene Mares. She will be due for repeat in 2 years (7 year f/u) due to polyps.  She was found to have eosinophilic esophagitis at that time and has been on omeprazole 40mg  daily with no concerns.  She has tried to come off of this in the past, but experiences increased reflux symptoms when stopping.   Mammogram this past spring No longer requires pap due to hysterectomy  She sees [facility] for skin care needs- she is having moles removed this week and will have those reports sent to our office.   She has questions  about COVID booster- her last booster was in June- she is interested in getting the new BiValent booster and would like to know when she should do this.   She is due for Shingles vaccine.   Past Medical History:  Diagnosis Date   Complex atypical endometrial hyperplasia 01/2019   s/p TLH/BSO, sentinel node biopsy   Esophagitis 06/2015   after 06/2015 endoscopy    Migraines    no aura   Syncopal episodes    situational   Past Surgical History:  Procedure Laterality Date   COLONOSCOPY  2017   ROBOTIC ASSISTED TOTAL HYSTERECTOMY WITH BILATERAL SALPINGO OOPHERECTOMY Bilateral 03/31/2019   Procedure: XI ROBOTIC ASSISTED TOTAL HYSTERECTOMY WITH BILATERAL SALPINGO OOPHORECTOMY;  Surgeon: Lafonda Mosses, MD;  Location: [facility];  Service: Gynecology;  Laterality: Bilateral;  BED HELD PATIENT TO BE DISCHARGED TODAY PER SCHEDULER   SENTINEL NODE BIOPSY N/A 03/31/2019   Procedure: SENTINEL NODE BIOPSY;  Surgeon: Lafonda Mosses, MD;  Location: [facility];  Service: Gynecology;  Laterality: N/A;   Family Status  Relation Name Status   Mother  (Not Specified)   Father  (Not Specified)   Brother  (Not Specified)   MGM  (Not Specified)   Neg Hx  (Not Specified)   Family History  Problem Relation Age of Onset   Hypertension Mother    Depression Mother    Hypertension Father  Depression Father    Depression Brother    Hypertension Maternal Grandmother    Breast cancer Neg Hx    Ovarian cancer Neg Hx    Endometrial cancer Neg Hx    Social History   Socioeconomic History   Marital status: Married    Spouse name: Not on file   Number of children: Not on file   Years of education: Not on file   Highest education level: Not on file  Occupational History   Not on file  Tobacco Use   Smoking status: Never   Smokeless tobacco: Never  Vaping Use   Vaping Use: Never used  Substance and Sexual Activity   Alcohol use: Yes    Alcohol/week: 1.0  standard drink    Types: 1 Glasses of wine per week   Drug use: No   Sexual activity: Yes    Partners: Male    Comment: vasectomy  Other Topics Concern   Not on file  Social History Narrative   Not on file   Social Determinants of Health   Financial Resource Strain: Not on file  Food Insecurity: Not on file  Transportation Needs: Not on file  Physical Activity: Not on file  Stress: Not on file  Social Connections: Not on file   Outpatient Medications Prior to Visit  Medication Sig   escitalopram (LEXAPRO) 10 MG tablet Take 1 tablet (10 mg total) by mouth daily.   [DISCONTINUED] omeprazole (PRILOSEC) 40 MG capsule Take 40 mg by mouth daily before breakfast.    No facility-administered medications prior to visit.   Allergies  Allergen Reactions   Penicillins Hives and Rash    Did it involve swelling of the face/tongue/throat, SOB, or low BP? No Did it involve sudden or severe rash/hives, skin peeling, or any reaction on the inside of your mouth or nose? Yes Did you need to seek medical attention at a hospital or doctor's office? Yes When did it last happen?      5 years If all above answers are "NO", may proceed with cephalosporin use.     Immunization History  Administered Date(s) Administered   Influenza,inj,Quad PF,6+ Mos 01/06/2016, 11/26/2016, 11/06/2020   Influenza,inj,quad, With Preservative 01/06/2016   Moderna SARS-COV2 Booster Vaccination 07/21/2020   PFIZER(Purple Top)SARS-COV-2 Vaccination 04/16/2019, 05/18/2019, 12/31/2019   Tdap 08/13/2011    Health Maintenance  Topic Date Due   HIV Screening  Never done   Hepatitis C Screening  Never done   COLONOSCOPY (Pts 45-96yrs Insurance coverage will need to be confirmed)  Never done   Zoster Vaccines- Shingrix (1 of 2) Never done   PAP SMEAR-Modifier  03/23/2021   MAMMOGRAM  07/14/2021   TETANUS/TDAP  08/12/2021   INFLUENZA VACCINE  Completed   COVID-19 Vaccine  Completed   HPV VACCINES  Aged Out     Patient Care Team: Estephania Licciardi, Coralee Pesa, NP as PCP - General (Nurse Practitioner) Megan Salon, MD as Consulting Physician (Gynecology)  Review of Systems All review of systems negative except what is listed in the HPI    Objective    BP 126/65   Pulse (!) 59   Ht 5\' 5"  (1.651 m)   Wt 233 lb 9.6 oz (106 kg)   LMP 02/20/2018 (Approximate)   SpO2 100%   BMI 38.87 kg/m  Physical Exam Vitals and nursing note reviewed.  Constitutional:      Appearance: Normal appearance. She is obese.  HENT:     Head: Normocephalic.  Eyes:  Extraocular Movements: Extraocular movements intact.     Conjunctiva/sclera: Conjunctivae normal.     Pupils: Pupils are equal, round, and reactive to light.  Cardiovascular:     Rate and Rhythm: Normal rate and regular rhythm.     Pulses: Normal pulses.     Heart sounds: Normal heart sounds.  Pulmonary:     Effort: Pulmonary effort is normal.     Breath sounds: Normal breath sounds.  Abdominal:     General: Abdomen is flat. Bowel sounds are normal.     Palpations: Abdomen is soft.  Musculoskeletal:        General: Normal range of motion.     Cervical back: Normal range of motion.     Right lower leg: No edema.     Left lower leg: No edema.  Skin:    General: Skin is warm and dry.     Capillary Refill: Capillary refill takes less than 2 seconds.  Neurological:     General: No focal deficit present.     Mental Status: She is alert and oriented to person, place, and time.  Psychiatric:        Mood and Affect: Mood normal.        Behavior: Behavior normal.        Thought Content: Thought content normal.        Judgment: Judgment normal.     Depression Screen PHQ 2/9 Scores 11/06/2020 09/27/2020 09/27/2020  PHQ - 2 Score 1 2 1   PHQ- 9 Score 3 8 -   No results found for any visits on 11/06/20.  Assessment & Plan      Problem List Items Addressed This Visit     Mild depression ([place])    Doing well on lexapro 10mg  daily Recommend continued  exercise to help with mood Will plan to continue medication through the winter months then consider taper if patient wishes. No changes at this time.       Encounter for medical examination to establish care - Primary    Review of current and past medical history, social history, medication, and family history.  Review of care gaps and health maintenance recommendations.  Records from recent providers to be requested if not available in Chart Review or Care Everywhere.  Recommendations for health maintenance, diet, and exercise provided.  Labs today: None- completed 09/2020 HM Recommendations: Flu vaccine today, Shingrix vaccine script given CPE due: 12 months Colonoscopy due 2024 Tetanus booster due July 2023      Vaccine counseling    Recommend bi-valent COVID booster at least 2-3 months after most recent booster of normal COVID vaccine.  Consider getting this in the next few months to help with protection through the fall and winter holidays.  Allow at least 2 weeks for immunization to build after vaccine.       Eosinophilic esophagitis    Labs reviewed today from last month with GYN- no concerning findings Refills provided for omeprazole Discussed option to change to pantoprazole if insurance no longer covers. She will let us know.  F/U in 1 year or sooner if needed      Relevant Medications   omeprazole (PRILOSEC) 40 MG capsule   Other Visit Diagnoses     Need for shingles vaccine       Relevant Medications   Zoster Vaccine Adjuvanted [facility]) injection   Need for immunization against influenza       Relevant Orders   Flu Vaccine QUAD 6+ mos IM (Fluarix)  Flu Vaccine QUAD 29mo+IM (Fluarix, Fluzone & Alfiuria Quad PF) (Completed)        Return in about 1 year (around 11/06/2021) for CPE with labs.     Time: 48 minutes, >50% spent counseling, care coordination, chart review, and documentation.   Kieon Lawhorn, Coralee Pesa, NP, DNP, AGNP-C Primary Care & Sports Medicine at  [place]

## 2020-11-06 NOTE — Assessment & Plan Note (Signed)
Recommend bi-valent COVID booster at least 2-3 months after most recent booster of normal COVID vaccine.  Consider getting this in the next few months to help with protection through the fall and winter holidays.  Allow at least 2 weeks for immunization to build after vaccine.

## 2020-11-06 NOTE — Assessment & Plan Note (Signed)
Labs reviewed today from last month with GYN- no concerning findings Refills provided for omeprazole Discussed option to change to pantoprazole if insurance no longer covers. She will let us know.  F/U in 1 year or sooner if needed

## 2020-11-06 NOTE — Assessment & Plan Note (Signed)
Doing well on lexapro 10mg  daily Recommend continued exercise to help with mood Will plan to continue medication through the winter months then consider taper if patient wishes. No changes at this time.

## 2020-11-08 DIAGNOSIS — D485 Neoplasm of uncertain behavior of skin: Secondary | ICD-10-CM | POA: Diagnosis not present

## 2020-11-10 ENCOUNTER — Other Ambulatory Visit (HOSPITAL_BASED_OUTPATIENT_CLINIC_OR_DEPARTMENT_OTHER): Payer: Self-pay

## 2020-11-10 MED ORDER — ZOSTER VAC RECOMB ADJUVANTED 50 MCG/0.5ML IM SUSR
INTRAMUSCULAR | 1 refills | Status: DC
  Filled 2020-11-10: qty 0.5, 1d supply, fill #0
  Filled 2021-03-01: qty 0.5, 1d supply, fill #1

## 2020-11-13 ENCOUNTER — Ambulatory Visit (HOSPITAL_BASED_OUTPATIENT_CLINIC_OR_DEPARTMENT_OTHER): Payer: BC Managed Care – PPO | Admitting: Nurse Practitioner

## 2020-12-04 ENCOUNTER — Other Ambulatory Visit (HOSPITAL_BASED_OUTPATIENT_CLINIC_OR_DEPARTMENT_OTHER): Payer: Self-pay

## 2020-12-12 DIAGNOSIS — C44519 Basal cell carcinoma of skin of other part of trunk: Secondary | ICD-10-CM | POA: Diagnosis not present

## 2020-12-14 ENCOUNTER — Ambulatory Visit: Payer: BC Managed Care – PPO

## 2020-12-20 ENCOUNTER — Ambulatory Visit: Payer: BC Managed Care – PPO | Attending: Internal Medicine

## 2020-12-20 DIAGNOSIS — Z23 Encounter for immunization: Secondary | ICD-10-CM

## 2020-12-20 NOTE — Progress Notes (Signed)
   Covid-19 Vaccination Clinic  Name:  Sherece Gambrill    MRN: [id number] DOB: Jun 25, 1965  12/20/2020  Ms. Huish was observed post Covid-19 immunization for 15 minutes without incident. She was provided with Vaccine Information Sheet and instruction to access the V-Safe system.   Ms. Poma was instructed to call 911 with any severe reactions post vaccine: Difficulty breathing  Swelling of face and throat  A fast heartbeat  A bad rash all over body  Dizziness and weakness   Immunizations Administered     Name Date Dose VIS Date Route   Pfizer Covid-19 Vaccine Bivalent Booster 12/20/2020  9:55 AM 0.3 mL 10/11/2020 Intramuscular   Manufacturer: [place]   Lot: [id number]   [place]: [place], PharmD, MBA Clinical Acute Care Pharmacist

## 2020-12-29 ENCOUNTER — Telehealth (HOSPITAL_BASED_OUTPATIENT_CLINIC_OR_DEPARTMENT_OTHER): Payer: Self-pay

## 2020-12-29 NOTE — Telephone Encounter (Signed)
Patient called today wanting to speak with someone about the way her visit was coded. She was seen on 10-08-22 and came back on August 18th for labs. Her insurance is having some issues with paying the bill due to the coding. Patient would like to speak to someone regarding this matter. tbw

## 2021-01-08 ENCOUNTER — Other Ambulatory Visit (HOSPITAL_BASED_OUTPATIENT_CLINIC_OR_DEPARTMENT_OTHER): Payer: Self-pay

## 2021-01-08 ENCOUNTER — Telehealth: Payer: BC Managed Care – PPO | Admitting: Emergency Medicine

## 2021-01-08 DIAGNOSIS — J069 Acute upper respiratory infection, unspecified: Secondary | ICD-10-CM | POA: Diagnosis not present

## 2021-01-08 MED ORDER — AZITHROMYCIN 250 MG PO TABS: ORAL_TABLET | ORAL | 0 refills | Status: DC

## 2021-01-08 MED ORDER — BENZONATATE 100 MG PO CAPS: 100.0000 mg | ORAL_CAPSULE | Freq: Two times a day (BID) | ORAL | 0 refills | Status: DC | PRN

## 2021-01-08 MED ORDER — PFIZER COVID-19 VAC BIVALENT 30 MCG/0.3ML IM SUSP
INTRAMUSCULAR | 0 refills | Status: DC
  Filled 2021-01-08: qty 0.3, 1d supply, fill #0

## 2021-01-08 MED ORDER — FLUTICASONE PROPIONATE 50 MCG/ACT NA SUSP: 2.0000 | Freq: Every day | NASAL | 0 refills | Status: DC

## 2021-01-08 NOTE — Progress Notes (Signed)
E-Visit for Upper Respiratory Infection   We are sorry you are not feeling well.  Here is how we plan to help!  Based on what you have shared with me, it looks like you may have a viral upper respiratory infection.  Upper respiratory infections are caused by a large number of viruses; however, rhinovirus is the most common cause.   I suspect this is all viral, but based on the description of your throat, you may be developing tonsillitis as well.  I've sent in a Z-pak to hopefully help with your symptoms.  Symptoms vary from person to person, with common symptoms including sore throat, cough, fatigue or lack of energy and feeling of general discomfort.  A low-grade fever of up to 100.4 may present, but is often uncommon.  Symptoms vary however, and are closely related to a person's age or underlying illnesses.  The most common symptoms associated with an upper respiratory infection are nasal discharge or congestion, cough, sneezing, headache and pressure in the ears and face.  These symptoms usually persist for about 3 to 10 days, but can last up to 2 weeks.  It is important to know that upper respiratory infections do not cause serious illness or complications in most cases.    Upper respiratory infections can be transmitted from person to person, with the most common method of transmission being a person's hands.  The virus is able to live on the skin and can infect other persons for up to 2 hours after direct contact.  Also, these can be transmitted when someone coughs or sneezes; thus, it is important to cover the mouth to reduce this risk.  To keep the spread of the illness at [place], good hand hygiene is very important.  This is an infection that is most likely caused by a virus. There are no specific treatments other than to help you with the symptoms until the infection runs its course.  We are sorry you are not feeling well.  Here is how we plan to help!   For nasal congestion, you may use an  oral decongestants such as Mucinex D or if you have glaucoma or high blood pressure use plain Mucinex.  Saline nasal spray or nasal drops can help and can safely be used as often as needed for congestion.  For your congestion, I have prescribed Fluticasone nasal spray one spray in each nostril twice a day  If you do not have a history of heart disease, hypertension, diabetes or thyroid disease, prostate/bladder issues or glaucoma, you may also use Sudafed to treat nasal congestion.  It is highly recommended that you consult with a pharmacist or your primary care physician to ensure this medication is safe for you to take.     If you have a cough, you may use cough suppressants such as Delsym and Robitussin.  If you have glaucoma or high blood pressure, you can also use Coricidin HBP.   For cough I have prescribed for you A prescription cough medication called Tessalon Perles 100 mg. You may take 1-2 capsules every 8 hours as needed for cough  If you have a sore or scratchy throat, use a saltwater gargle-  to  teaspoon of salt dissolved in a 4-ounce to 8-ounce glass of warm water.  Gargle the solution for approximately 15-30 seconds and then spit.  It is important not to swallow the solution.  You can also use throat lozenges/cough drops and Chloraseptic spray to help with throat pain or  discomfort.  Warm or cold liquids can also be helpful in relieving throat pain.  For headache, pain or general discomfort, you can use Ibuprofen or Tylenol as directed.   Some authorities believe that zinc sprays or the use of Echinacea may shorten the course of your symptoms.   HOME CARE Only take medications as instructed by your medical team. Be sure to drink plenty of fluids. Water is fine as well as fruit juices, sodas and electrolyte beverages. You may want to stay away from caffeine or alcohol. If you are nauseated, try taking small sips of liquids. How do you know if you are getting enough fluid? Your urine  should be a pale yellow or almost colorless. Get rest. Taking a steamy shower or using a humidifier may help nasal congestion and ease sore throat pain. You can place a towel over your head and breathe in the steam from hot water coming from a faucet. Using a saline nasal spray works much the same way. Cough drops, hard candies and sore throat lozenges may ease your cough. Avoid close contacts especially the very young and the elderly Cover your mouth if you cough or sneeze Always remember to wash your hands.   GET HELP RIGHT AWAY IF: You develop worsening fever. If your symptoms do not improve within 10 days You develop yellow or green discharge from your nose over 3 days. You have coughing fits You develop a severe head ache or visual changes. You develop shortness of breath, difficulty breathing or start having chest pain Your symptoms persist after you have completed your treatment plan  MAKE SURE YOU  Understand these instructions. Will watch your condition. Will get help right away if you are not doing well or get worse.  Thank you for choosing an e-visit.  Your e-visit answers were reviewed by a board certified advanced clinical practitioner to complete your personal care plan. Depending upon the condition, your plan could have included both over the counter or prescription medications.  Please review your pharmacy choice. Make sure the pharmacy is open so you can pick up prescription now. If there is a problem, you may contact your provider through [employer] and have the prescription routed to another pharmacy.  Your safety is important to [place]. If you have drug allergies check your prescription carefully.   For the next 24 hours you can use MyChart to ask questions about today's visit, request a non-urgent call back, or ask for a work or school excuse. You will get an email in the next two days asking about your experience. I hope that your e-visit has been valuable and  will speed your recovery.    Approximately 5 minutes was used in reviewing the patient's chart, questionnaire, prescribing medications, and documentation.

## 2021-01-17 DIAGNOSIS — C4491 Basal cell carcinoma of skin, unspecified: Secondary | ICD-10-CM | POA: Diagnosis not present

## 2021-01-17 DIAGNOSIS — Z481 Encounter for planned postprocedural wound closure: Secondary | ICD-10-CM | POA: Diagnosis not present

## 2021-01-17 DIAGNOSIS — C44319 Basal cell carcinoma of skin of other parts of face: Secondary | ICD-10-CM | POA: Diagnosis not present

## 2021-03-01 ENCOUNTER — Other Ambulatory Visit (HOSPITAL_BASED_OUTPATIENT_CLINIC_OR_DEPARTMENT_OTHER): Payer: Self-pay

## 2021-06-22 DIAGNOSIS — R319 Hematuria, unspecified: Secondary | ICD-10-CM | POA: Diagnosis not present

## 2021-06-22 DIAGNOSIS — R3129 Other microscopic hematuria: Secondary | ICD-10-CM | POA: Diagnosis not present

## 2021-06-26 ENCOUNTER — Encounter (HOSPITAL_BASED_OUTPATIENT_CLINIC_OR_DEPARTMENT_OTHER): Payer: Self-pay | Admitting: Nurse Practitioner

## 2021-06-26 DIAGNOSIS — L570 Actinic keratosis: Secondary | ICD-10-CM | POA: Diagnosis not present

## 2021-06-26 DIAGNOSIS — L578 Other skin changes due to chronic exposure to nonionizing radiation: Secondary | ICD-10-CM | POA: Diagnosis not present

## 2021-06-26 DIAGNOSIS — D1801 Hemangioma of skin and subcutaneous tissue: Secondary | ICD-10-CM | POA: Diagnosis not present

## 2021-06-26 DIAGNOSIS — L814 Other melanin hyperpigmentation: Secondary | ICD-10-CM | POA: Diagnosis not present

## 2021-06-26 DIAGNOSIS — L821 Other seborrheic keratosis: Secondary | ICD-10-CM | POA: Diagnosis not present

## 2021-09-12 ENCOUNTER — Other Ambulatory Visit: Payer: Self-pay | Admitting: Nurse Practitioner

## 2021-09-12 DIAGNOSIS — Z1231 Encounter for screening mammogram for malignant neoplasm of breast: Secondary | ICD-10-CM

## 2021-10-02 ENCOUNTER — Ambulatory Visit: Payer: BC Managed Care – PPO

## 2021-11-07 ENCOUNTER — Ambulatory Visit: Payer: BC Managed Care – PPO

## 2021-11-08 ENCOUNTER — Ambulatory Visit (INDEPENDENT_AMBULATORY_CARE_PROVIDER_SITE_OTHER): Payer: BC Managed Care – PPO | Admitting: Nurse Practitioner

## 2021-11-08 ENCOUNTER — Encounter (HOSPITAL_BASED_OUTPATIENT_CLINIC_OR_DEPARTMENT_OTHER): Payer: Self-pay | Admitting: Nurse Practitioner

## 2021-11-08 VITALS — BP 110/67 | HR 65 | Ht 65.0 in | Wt 248.0 lb

## 2021-11-08 DIAGNOSIS — Z0000 Encounter for general adult medical examination without abnormal findings: Secondary | ICD-10-CM | POA: Diagnosis not present

## 2021-11-08 DIAGNOSIS — K200 Eosinophilic esophagitis: Secondary | ICD-10-CM | POA: Diagnosis not present

## 2021-11-08 DIAGNOSIS — F32A Depression, unspecified: Secondary | ICD-10-CM | POA: Diagnosis not present

## 2021-11-08 DIAGNOSIS — Z23 Encounter for immunization: Secondary | ICD-10-CM | POA: Diagnosis not present

## 2021-11-08 DIAGNOSIS — F320 Major depressive disorder, single episode, mild: Secondary | ICD-10-CM

## 2021-11-08 DIAGNOSIS — K219 Gastro-esophageal reflux disease without esophagitis: Secondary | ICD-10-CM | POA: Insufficient documentation

## 2021-11-08 HISTORY — DX: Major depressive disorder, single episode, mild: F32.0

## 2021-11-08 MED ORDER — OMEPRAZOLE 40 MG PO CPDR: 40.0000 mg | DELAYED_RELEASE_CAPSULE | Freq: Every day | ORAL | 3 refills | Status: DC

## 2021-11-08 MED ORDER — ESCITALOPRAM OXALATE 10 MG PO TABS: 10.0000 mg | ORAL_TABLET | Freq: Every day | ORAL | 3 refills | Status: DC

## 2021-11-08 NOTE — Patient Instructions (Signed)
It was a pleasure seeing you today. I hope your time spent with [place] was pleasant and helpful. Please let us know if there is anything we can do to improve the service you receive.   We will let you know what your labs look like.   Everything looks wonderful today!!  I am so proud of you for getting involved in pickleball!!    Important Office Information Lab Results If labs were ordered, please note that you will see results through [place] as soon as they come available from [place].  It takes up to 5 business days for the results to be routed to me and for me to review them once all of the lab results have come through from [facility]. I will make recommendations based on your results and send these through [place] or someone from the office will call you to discuss. If your labs are abnormal, we may contact you to schedule a visit to discuss the results and make recommendations.  If you have not heard from [place] within 5 business days or you have waited longer than a week and your lab results have not come through on [place], please feel free to call the office or send a message through [place] to follow-up on these labs.   Referrals If referrals were placed today, the office where the referral was sent will contact you either by phone or through [address] to set up scheduling. Please note that it can take up to a week for the referral office to contact you. If you do not hear from them in a week, please contact the referral office directly to inquire about scheduling.   Condition Treated If your condition worsens or you begin to have new symptoms, please schedule a follow-up appointment for further evaluation. If you are not sure if an appointment is needed, you may call the office to leave a message for the nurse and someone will contact you with recommendations.  If you have an urgent or life threatening emergency, please do not call the office, but seek emergency evaluation by calling 911 or going  to the nearest emergency room for evaluation.   MyChart and Phone Calls Please do not use MyChart for urgent messages. It may take up to 3 business days for MyChart messages to be read by staff and if they are unable to handle the request, an additional 3 business days for them to be routed to me and for my response.  Messages sent to the provider through [place] do not come directly to the provider, please allow time for these messages to be routed and for me to respond.  We get a large volume of MyChart messages daily and these are responded to in the order received.   For urgent messages, please call the office at [phone] and speak with the front office staff or leave a message on the line of my assistant for guidance.  We are seeing patients from the hours of 8:00 am through 5:00 pm and calls directly to the nurse may not be answered immediately due to seeing patients, but your call will be returned as soon as possible.  Phone  messages received after 4:00 PM Monday through Thursday may not be returned until the following business day. Phone messages received after 11:00 AM on Friday may not be returned until Monday.   After Hours We share on call hours with providers from other offices. If you have an urgent need after hours that cannot wait until  the next business day, please contact the on call provider by calling the office number. A nurse will speak with you and contact the provider if needed for recommendations.  If you have an urgent or life threatening emergency after hours, please do not call the on call provider, but seek emergency evaluation by calling 911 or going to the nearest emergency room for evaluation.   Paperwork All paperwork requires a minimum of 5 days to complete and return to you or the designated personnel. Please keep this in mind when bringing in forms or sending requests for paperwork completion to the office.

## 2021-11-08 NOTE — Assessment & Plan Note (Signed)

## 2021-11-08 NOTE — Progress Notes (Signed)
BP 110/67   Pulse 65   Ht '5\' 5"'$  (1.651 m)   Wt 248 lb (112.5 kg)   LMP 02/20/2018 (Approximate)   SpO2 98%   BMI 41.27 kg/m    Subjective:    Patient ID: Donna Shepard, female    DOB: 1965/07/06, 56 y.o.   MRN: [id number]  HPI: Donna Shepard is a 56 y.o. female presenting on 11/08/2021 for comprehensive medical examination.   Current medical concerns include:none  She reports regular vision exams q1-5y: yes She reports regular dental exams q 84m yes Her diet consists of:  healthy options She endorses exercise and/or activity of:  pickleball 3hr/wk, walking 2hr/wk She works in:  [employer], maintenance (IT) She endorses ETOH use ( 1-2/wk ) She denies nictoine use  She denies illegal substance use   She is not having menstrual periods.  She  denies abnormal bleeding. She  endorses menopausal symptoms.  She is is sexually active. She currently has 1 sexual partners.  She denies concerns today about STI: testing ordered: no  She denies concerns about skin changes today  She denies concerns about bowel changes today  She denies concerns about bladder changes today   Most Recent Depression Screen:     11/08/2021    7:13 PM 11/06/2020   10:41 AM 09/27/2020    2:48 PM 09/27/2020    2:19 PM  Depression screen PHQ 2/9  Decreased Interest 0 0 1 0  Down, Depressed, Hopeless 0 '1 1 1  '$ PHQ - 2 Score 0 '1 2 1  '$ Altered sleeping  0 1   Tired, decreased energy  1 1   Change in appetite  1 1   Feeling bad or failure about yourself   0 1   Trouble concentrating  0 1   Moving slowly or fidgety/restless  0 1   Suicidal thoughts  0 0   PHQ-9 Score  3 8   Difficult doing work/chores  Not difficult at all     Most Recent Anxiety Screen:     11/06/2020   10:41 AM  GAD 7 : Generalized Anxiety Score  Nervous, Anxious, on Edge 0  Control/stop worrying 0  Worry too much - different things 0  Trouble relaxing 0  Restless 0  Easily annoyed or irritable 0  Afraid - awful might happen 0  Total GAD 7  Score 0   Most Recent Fall Screen:    11/08/2021    9:42 AM 11/06/2020   10:40 AM  Fall Risk   Falls in the past year? 0 0  Number falls in past yr: 0 0  Injury with Fall? 0 0  Risk for fall due to : No Fall Risks No Fall Risks  Follow up Education provided;Falls evaluation completed Falls evaluation completed    All ROS negative except what is listed above and in the HPI.   Past medical history, surgical history, medications, allergies, family history and social history reviewed with patient today and changes made to appropriate areas of the chart.  Past Medical History:  Past Medical History:  Diagnosis Date   Complex atypical endometrial hyperplasia 01/2019   s/p TLH/BSO, sentinel node biopsy   Esophagitis 06/2015   after 06/2015 endoscopy    Major depressive disorder, single episode, mild ([facility] 11/08/2021   Migraines    no aura   Syncopal episodes    situational   Medications:  No current outpatient medications on file prior to visit.   No current facility-administered medications on file  prior to visit.   Surgical History:  Past Surgical History:  Procedure Laterality Date   COLONOSCOPY  2017   ROBOTIC ASSISTED TOTAL HYSTERECTOMY WITH BILATERAL SALPINGO OOPHERECTOMY Bilateral 03/31/2019   Procedure: XI ROBOTIC ASSISTED TOTAL HYSTERECTOMY WITH BILATERAL SALPINGO OOPHORECTOMY;  Surgeon: Lafonda Mosses, MD;  Location: [facility];  Service: Gynecology;  Laterality: Bilateral;  BED HELD PATIENT TO BE DISCHARGED TODAY PER SCHEDULER   SENTINEL NODE BIOPSY N/A 03/31/2019   Procedure: SENTINEL NODE BIOPSY;  Surgeon: Lafonda Mosses, MD;  Location: [facility];  Service: Gynecology;  Laterality: N/A;   Allergies:  Allergies  Allergen Reactions   Penicillins Hives and Rash    Did it involve swelling of the face/tongue/throat, SOB, or low BP? No Did it involve sudden or severe rash/hives, skin peeling, or any reaction on the inside of your  mouth or nose? Yes Did you need to seek medical attention at a hospital or doctor's office? Yes When did it last happen?      5 years If all above answers are "NO", may proceed with cephalosporin use.    Social History:  Social History   Socioeconomic History   Marital status: Married    Spouse name: Not on file   Number of children: Not on file   Years of education: Not on file   Highest education level: Not on file  Occupational History   Not on file  Tobacco Use   Smoking status: Never   Smokeless tobacco: Never  Vaping Use   Vaping Use: Never used  Substance and Sexual Activity   Alcohol use: Yes    Alcohol/week: 1.0 standard drink of alcohol    Types: 1 Glasses of wine per week   Drug use: No   Sexual activity: Yes    Partners: Male    Comment: vasectomy  Other Topics Concern   Not on file  Social History Narrative   Not on file   Social Determinants of Health   Financial Resource Strain: Not on file  Food Insecurity: Not on file  Transportation Needs: Not on file  Physical Activity: Not on file  Stress: Not on file  Social Connections: Not on file  Intimate Partner Violence: Not on file   Social History   Tobacco Use  Smoking Status Never  Smokeless Tobacco Never   Social History   Substance and Sexual Activity  Alcohol Use Yes   Alcohol/week: 1.0 standard drink of alcohol   Types: 1 Glasses of wine per week   Family History:  Family History  Problem Relation Age of Onset   Hypertension Mother    Depression Mother    Hypertension Father    Depression Father    Depression Brother    Hypertension Maternal Grandmother    Breast cancer Neg Hx    Ovarian cancer Neg Hx    Endometrial cancer Neg Hx        Objective:    BP 110/67   Pulse 65   Ht '5\' 5"'$  (1.651 m)   Wt 248 lb (112.5 kg)   LMP 02/20/2018 (Approximate)   SpO2 98%   BMI 41.27 kg/m   Wt Readings from Last 3 Encounters:  11/08/21 248 lb (112.5 kg)  11/06/20 233 lb 9.6 oz (106  kg)  09/27/20 238 lb (108 kg)    Physical Exam Vitals and nursing note reviewed.  Constitutional:      General: She is not in acute distress.    Appearance:  Normal appearance.  HENT:     Head: Normocephalic and atraumatic.     Right Ear: Hearing, tympanic membrane, ear canal and external ear normal.     Left Ear: Hearing, tympanic membrane, ear canal and external ear normal.     Nose: Nose normal.     Right Sinus: No maxillary sinus tenderness or frontal sinus tenderness.     Left Sinus: No maxillary sinus tenderness or frontal sinus tenderness.     Mouth/Throat:     Lips: Pink.     Mouth: Mucous membranes are moist.     Pharynx: Oropharynx is clear.  Eyes:     General: Lids are normal. Vision grossly intact.     Extraocular Movements: Extraocular movements intact.     Conjunctiva/sclera: Conjunctivae normal.     Pupils: Pupils are equal, round, and reactive to light.     Funduscopic exam:    Right eye: Red reflex present.        Left eye: Red reflex present.    Visual Fields: Right eye visual fields normal and left eye visual fields normal.  Neck:     Thyroid: No thyromegaly.     Vascular: No carotid bruit.  Cardiovascular:     Rate and Rhythm: Normal rate and regular rhythm.     Chest Wall: PMI is not displaced.     Pulses: Normal pulses.          Dorsalis pedis pulses are 2+ on the right side and 2+ on the left side.       Posterior tibial pulses are 2+ on the right side and 2+ on the left side.     Heart sounds: Normal heart sounds. No murmur heard. Pulmonary:     Effort: Pulmonary effort is normal. No respiratory distress.     Breath sounds: Normal breath sounds.  Abdominal:     General: Abdomen is flat. Bowel sounds are normal. There is no distension.     Palpations: Abdomen is soft. There is no hepatomegaly, splenomegaly or mass.     Tenderness: There is no abdominal tenderness. There is no right CVA tenderness, left CVA tenderness, guarding or rebound.   Musculoskeletal:        General: Normal range of motion.     Cervical back: Full passive range of motion without pain, normal range of motion and neck supple. No tenderness.     Right lower leg: No edema.     Left lower leg: No edema.  Feet:     Left foot:     Toenail Condition: Left toenails are normal.  Lymphadenopathy:     Cervical: No cervical adenopathy.     Upper Body:     Right upper body: No supraclavicular adenopathy.     Left upper body: No supraclavicular adenopathy.  Skin:    General: Skin is warm and dry.     Capillary Refill: Capillary refill takes less than 2 seconds.     Nails: There is no clubbing.  Neurological:     General: No focal deficit present.     Mental Status: She is alert and oriented to person, place, and time.     GCS: GCS eye subscore is 4. GCS verbal subscore is 5. GCS motor subscore is 6.     Sensory: Sensation is intact.     Motor: Motor function is intact.     Coordination: Coordination is intact.     Gait: Gait is intact.     Deep Tendon Reflexes:  Reflexes are normal and symmetric.  Psychiatric:        Attention and Perception: Attention normal.        Mood and Affect: Mood normal.        Speech: Speech normal.        Behavior: Behavior normal. Behavior is cooperative.        Thought Content: Thought content normal.        Cognition and Memory: Cognition and memory normal.        Judgment: Judgment normal.     Results for orders placed or performed in visit on 11/06/20  HM COLONOSCOPY  Result Value Ref Range   HM Colonoscopy See Report (in chart) See Report (in chart), Patient Reported    MMUNIZATIONS:   - Tdap: Tetanus vaccination status reviewed: tetanus status unknown to the patient. - Influenza: Administered today - Pneumovax: Not applicable - Prevnar: Not applicable - HPV: Not applicable - Zostavax vaccine:  UTD  SCREENING COMPLETED: - Pap smear: Not Applicable - STI testing:Not Applicable -Mammogram:  UTD -  Colonoscopy: { UTD - Bone Density: {Not Applicable -Hearing Test: Not Applicable -Spirometry: Not Applicable     Assessment & Plan:   Problem List Items Addressed This Visit     Mild depression    Chronic. Well controlled on lexapro '10mg'$  daily. No sx at this time. Continue current regimen.       Relevant Medications   escitalopram (LEXAPRO) 10 MG tablet   Other Relevant Orders   TSH   T4, free   Encounter for annual physical exam - Primary    CPE today with no abnormalities noted on exam.  Labs pending. Will make changes as necessary based on results.  Review of HM activities and recommendations discussed and provided on AVS Anticipatory guidance, diet, and exercise recommendations provided.  Medications, allergies, and hx reviewed and updated as necessary.  Plan to f/u with CPE in 1 year or sooner for acute/chronic health needs as directed.        Relevant Medications   escitalopram (LEXAPRO) 10 MG tablet   omeprazole (PRILOSEC) 40 MG capsule   Other Relevant Orders   CBC with Differential/Platelet   Comprehensive metabolic panel   Lipid panel   Hemoglobin A1c   VITAMIN D 25 Hydroxy (Vit-D Deficiency, Fractures)   TSH   T4, free   Eosinophilic esophagitis    Chronic. Well controlled. Refill on omeprazole today. No concerning findings on exam.       Relevant Medications   omeprazole (PRILOSEC) 40 MG capsule   Other Relevant Orders   CBC with Differential/Platelet   RESOLVED: Major depressive disorder, single episode, mild (HCC)   Relevant Medications   escitalopram (LEXAPRO) 10 MG tablet   Other Visit Diagnoses     Health care maintenance       Relevant Medications   escitalopram (LEXAPRO) 10 MG tablet   omeprazole (PRILOSEC) 40 MG capsule   Other Relevant Orders   CBC with Differential/Platelet   Comprehensive metabolic panel   Lipid panel   Hemoglobin A1c   VITAMIN D 25 Hydroxy (Vit-D Deficiency, Fractures)   TSH   T4, free   Flu vaccine need        Relevant Orders   Flu Vaccine QUAD 6+ mos PF IM (Fluarix Quad PF) (Completed)          Follow up plan: Return in about 1 year (around 11/09/2022) for CPE.  NEXT PREVENTATIVE PHYSICAL DUE IN 1 YEAR.  PATIENT COUNSELING PROVIDED  FOR ALL ADULT PATIENTS:  Consume a well balanced diet low in saturated fats, cholesterol, and moderation in carbohydrates.   This can be as simple as monitoring portion sizes and cutting back on sugary beverages such as soda and juice to start with.    Daily water consumption of at least 64 ounces.  Physical activity at least 180 minutes per week, if just starting out.   This can be as simple as taking the stairs instead of the elevator and walking 2-3 laps around the office  purposefully every day.   STD protection, partner selection, and regular testing if high risk.  Limited consumption of alcoholic beverages if alcohol is consumed.  For women, I recommend no more than 7 alcoholic beverages per week, spread out throughout the week.  Avoid "binge" drinking or consuming large quantities of alcohol in one setting.   Please let me know if you feel you may need help with reduction or quitting alcohol consumption.   Avoidance of nicotine, if used.  Please let me know if you feel you may need help with reduction or quitting nicotine use.   Daily mental health attention.  This can be in the form of 5 minute daily meditation, prayer, journaling, yoga, reflection, etc.   Purposeful attention to your emotions and mental state can significantly improve your overall wellbeing and Health.  Please know that I am here to help you with all of your health care goals and am happy to work with you to find a solution that works best for you.  The greatest advice I have received with any changes in life are to take it one step at a time, that even means if all you can focus on is the next 60 seconds, then do that and celebrate your victories.  With any changes in life, you  will have set backs, and that is OK. The important thing to remember is, if you have a set back, it is not a failure, it is an opportunity to try again!  Health Maintenance Recommendations Screening Testing Mammogram Every 1 -2 years based on history and risk factors Starting at age 65 Pap Smear Ages 21-39 every 3 years Ages 80-65 every 5 years with HPV testing More frequent testing may be required based on results and history Colon Cancer Screening Every 1-10 years based on test performed, risk factors, and history Starting at age 74 Bone Density Screening Every 2-10 years based on history Starting at age 21 for women Recommendations for men differ based on medication usage, history, and risk factors AAA Screening One time ultrasound Men 70-6 years old who have every smoked Lung Cancer Screening Low Dose Lung CT every 12 months Age 25-80 years with a 30 pack-year smoking history who still smoke or who have quit within the last 15 years  Screening Labs Routine  Labs: Complete Blood Count (CBC), Complete Metabolic Panel (CMP), Cholesterol (Lipid Panel) Every 6-12 months based on history and medications May be recommended more frequently based on current conditions or previous results Hemoglobin A1c Lab Every 3-12 months based on history and previous results Starting at age 85 or earlier with diagnosis of diabetes, high cholesterol, BMI >26, and/or risk factors Frequent monitoring for patients with diabetes to ensure blood sugar control Thyroid Panel (TSH w/ T3 & T4) Every 6 months based on history, symptoms, and risk factors May be repeated more often if on medication HIV One time testing for all patients 12 and older May be repeated more frequently  for patients with increased risk factors or exposure Hepatitis C One time testing for all patients 18 and older May be repeated more frequently for patients with increased risk factors or exposure Gonorrhea, Chlamydia Every 12  months for all sexually active persons 13-24 years Additional monitoring may be recommended for those who are considered high risk or who have symptoms PSA Men 23-36 years old with risk factors Additional screening may be recommended from age 71-69 based on risk factors, symptoms, and history  Vaccine Recommendations Tetanus Booster All adults every 10 years Flu Vaccine All patients 6 months and older every year COVID Vaccine All patients 12 years and older Initial dosing with booster May recommend additional booster based on age and health history HPV Vaccine 2 doses all patients age 94-26 Dosing may be considered for patients over 26 Shingles Vaccine (Shingrix) 2 doses all adults 70 years and older Pneumonia (Pneumovax 23) All adults 28 years and older May recommend earlier dosing based on health history Pneumonia (Prevnar 87) All adults 79 years and older Dosed 1 year after Pneumovax 23  Additional Screening, Testing, and Vaccinations may be recommended on an individualized basis based on family history, health history, risk factors, and/or exposure.

## 2021-11-08 NOTE — Assessment & Plan Note (Signed)
Chronic. Well controlled. Refill on omeprazole today. No concerning findings on exam.

## 2021-11-08 NOTE — Assessment & Plan Note (Signed)
Chronic. Well controlled on lexapro '10mg'$  daily. No sx at this time. Continue current regimen.

## 2021-11-09 LAB — CBC WITH DIFFERENTIAL/PLATELET
Basophils Absolute: 0.1 10*3/uL (ref 0.0–0.2)
Basos: 1 %
EOS (ABSOLUTE): 0.2 10*3/uL (ref 0.0–0.4)
Eos: 3 %
Hematocrit: 39.8 % (ref 34.0–46.6)
Hemoglobin: 12.8 g/dL (ref 11.1–15.9)
Immature Grans (Abs): 0 10*3/uL (ref 0.0–0.1)
Immature Granulocytes: 0 %
Lymphocytes Absolute: 1.8 10*3/uL (ref 0.7–3.1)
Lymphs: 25 %
MCH: 26.8 pg (ref 26.6–33.0)
MCHC: 32.2 g/dL (ref 31.5–35.7)
MCV: 83 fL (ref 79–97)
Monocytes Absolute: 0.5 10*3/uL (ref 0.1–0.9)
Monocytes: 8 %
Neutrophils Absolute: 4.4 10*3/uL (ref 1.4–7.0)
Neutrophils: 63 %
Platelets: 345 10*3/uL (ref 150–450)
RBC: 4.77 x10E6/uL (ref 3.77–5.28)
RDW: 13.7 % (ref 11.7–15.4)
WBC: 7 10*3/uL (ref 3.4–10.8)

## 2021-11-09 LAB — LIPID PANEL
Chol/HDL Ratio: 3.2 ratio (ref 0.0–4.4)
Cholesterol, Total: 178 mg/dL (ref 100–199)
HDL: 55 mg/dL (ref >39)
LDL Chol Calc (NIH): 101 mg/dL — ABNORMAL HIGH (ref 0–99)
Triglycerides: 123 mg/dL (ref 0–149)
VLDL Cholesterol Cal: 22 mg/dL (ref 5–40)

## 2021-11-09 LAB — COMPREHENSIVE METABOLIC PANEL
ALT: 19 IU/L (ref 0–32)
AST: 21 IU/L (ref 0–40)
Albumin/Globulin Ratio: 1.8 (ref 1.2–2.2)
Albumin: 4.4 g/dL (ref 3.8–4.9)
Alkaline Phosphatase: 125 IU/L — ABNORMAL HIGH (ref 44–121)
BUN/Creatinine Ratio: 12 (ref 9–23)
BUN: 11 mg/dL (ref 6–24)
Bilirubin Total: 0.3 mg/dL (ref 0.0–1.2)
CO2: 23 mmol/L (ref 20–29)
Calcium: 9.5 mg/dL (ref 8.7–10.2)
Chloride: 99 mmol/L (ref 96–106)
Creatinine, Ser: 0.95 mg/dL (ref 0.57–1.00)
Globulin, Total: 2.5 g/dL (ref 1.5–4.5)
Glucose: 78 mg/dL (ref 70–99)
Potassium: 4.7 mmol/L (ref 3.5–5.2)
Sodium: 138 mmol/L (ref 134–144)
Total Protein: 6.9 g/dL (ref 6.0–8.5)
eGFR: 70 mL/min/{1.73_m2} (ref >59)

## 2021-11-09 LAB — HEMOGLOBIN A1C
Est. average glucose Bld gHb Est-mCnc: 111 mg/dL
Hgb A1c MFr Bld: 5.5 % (ref 4.8–5.6)

## 2021-11-09 LAB — VITAMIN D 25 HYDROXY (VIT D DEFICIENCY, FRACTURES): Vit D, 25-Hydroxy: 29 ng/mL — ABNORMAL LOW (ref 30.0–100.0)

## 2021-11-09 LAB — T4, FREE: Free T4: 1.11 ng/dL (ref 0.82–1.77)

## 2021-11-09 LAB — TSH: TSH: 3.13 u[IU]/mL (ref 0.450–4.500)

## 2021-11-19 ENCOUNTER — Ambulatory Visit
Admission: RE | Admit: 2021-11-19 | Discharge: 2021-11-19 | Disposition: A | Discharge status: Home / Self Care | Payer: BC Managed Care – PPO | Source: Ambulatory Visit | Attending: Nurse Practitioner | Admitting: Nurse Practitioner

## 2021-11-19 DIAGNOSIS — Z1231 Encounter for screening mammogram for malignant neoplasm of breast: Secondary | ICD-10-CM

## 2021-12-19 DIAGNOSIS — L578 Other skin changes due to chronic exposure to nonionizing radiation: Secondary | ICD-10-CM | POA: Diagnosis not present

## 2021-12-19 DIAGNOSIS — L821 Other seborrheic keratosis: Secondary | ICD-10-CM | POA: Diagnosis not present

## 2021-12-19 DIAGNOSIS — L814 Other melanin hyperpigmentation: Secondary | ICD-10-CM | POA: Diagnosis not present

## 2021-12-19 DIAGNOSIS — D229 Melanocytic nevi, unspecified: Secondary | ICD-10-CM | POA: Diagnosis not present

## 2021-12-29 IMAGING — MG DIGITAL SCREENING BILAT W/ TOMO W/ CAD
8 series · 8 of 24 positions shown · non-contrast
Comparison: Previous exam(s).

CLINICAL DATA: Screening.

EXAM:
DIGITAL SCREENING BILATERAL MAMMOGRAM WITH TOMO AND CAD

[R MLO synth-2D]
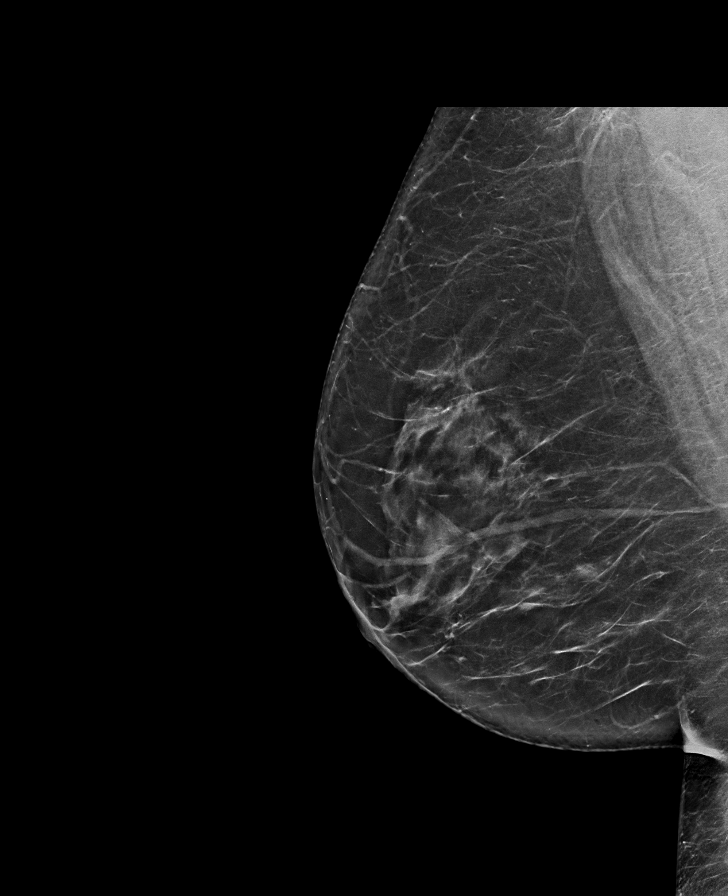

[R CC synth-2D]
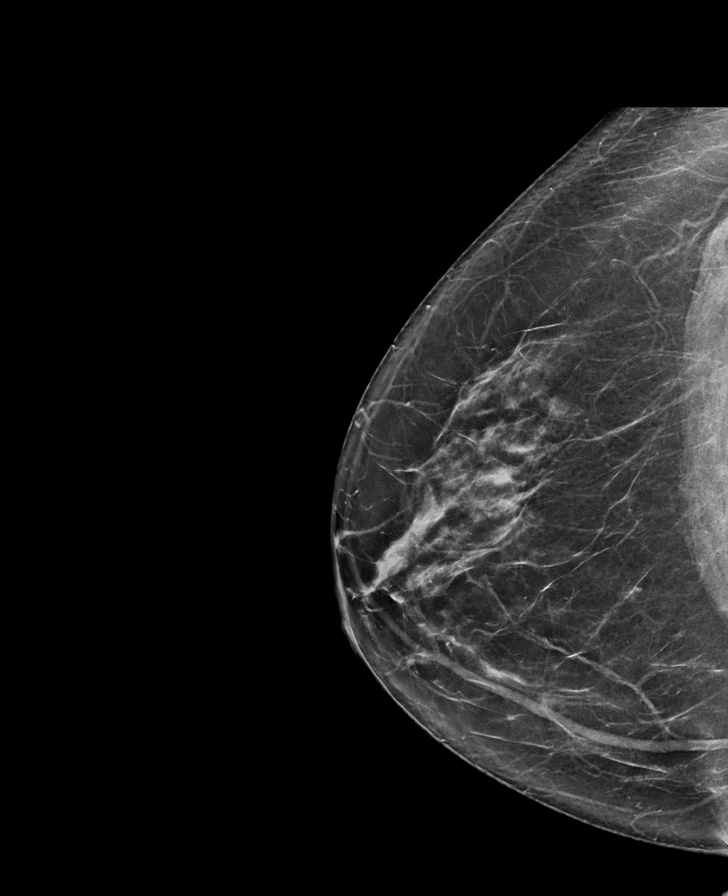

[L CC synth-2D]
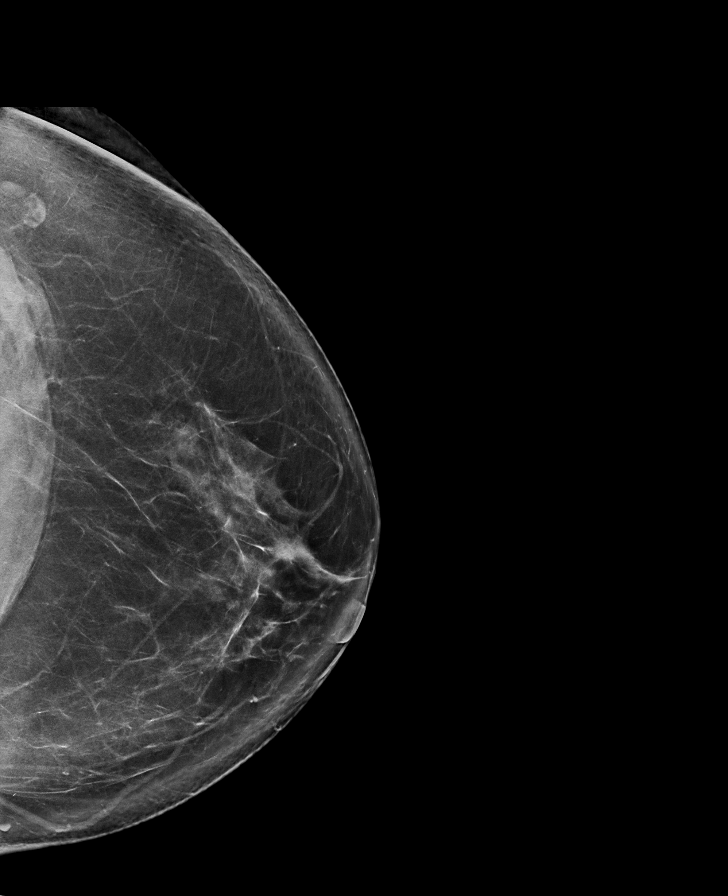

[L MLO synth-2D]
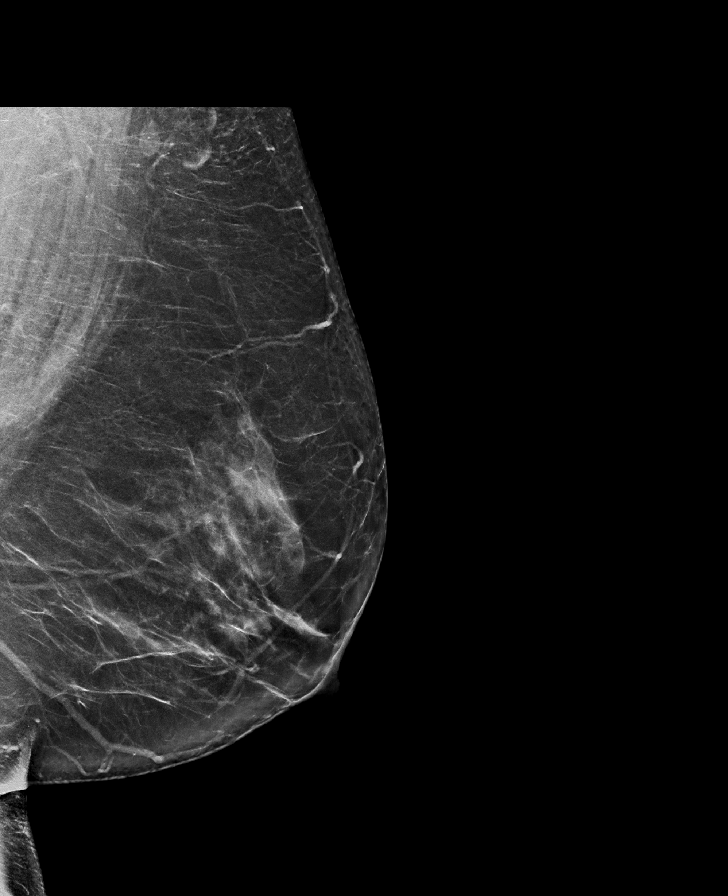

[R MLO tomo · tomo slice 40/79.0]
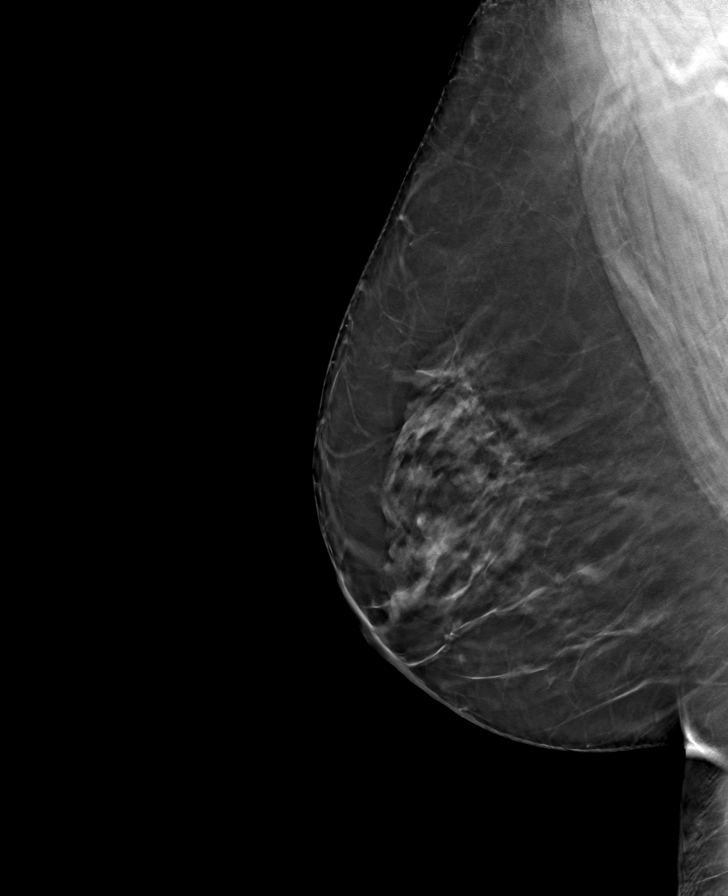

[R CC tomo · tomo slice 38/75.0]
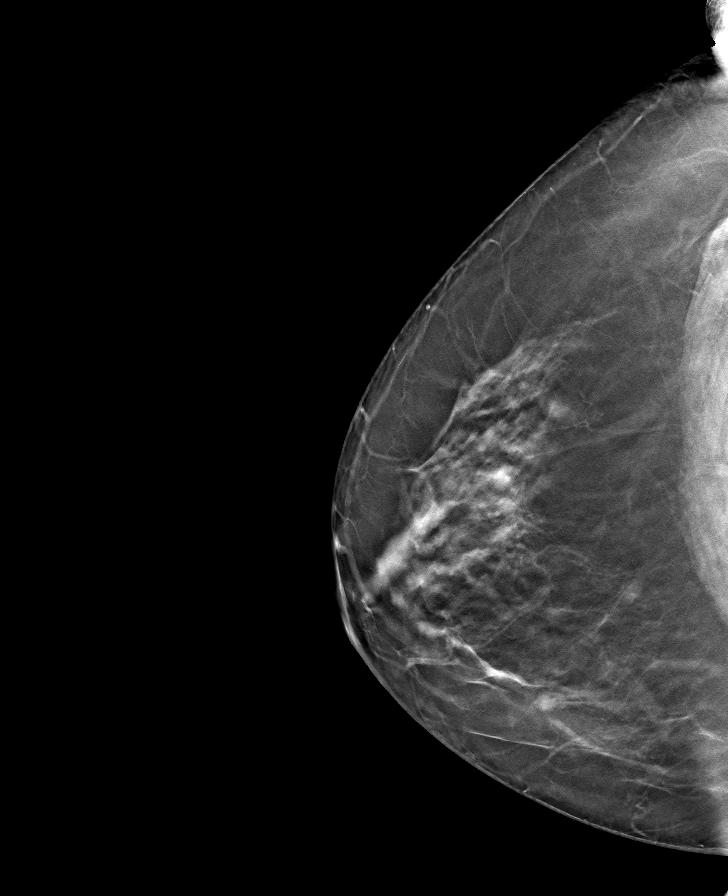

[L MLO tomo · tomo slice 41/82.0]
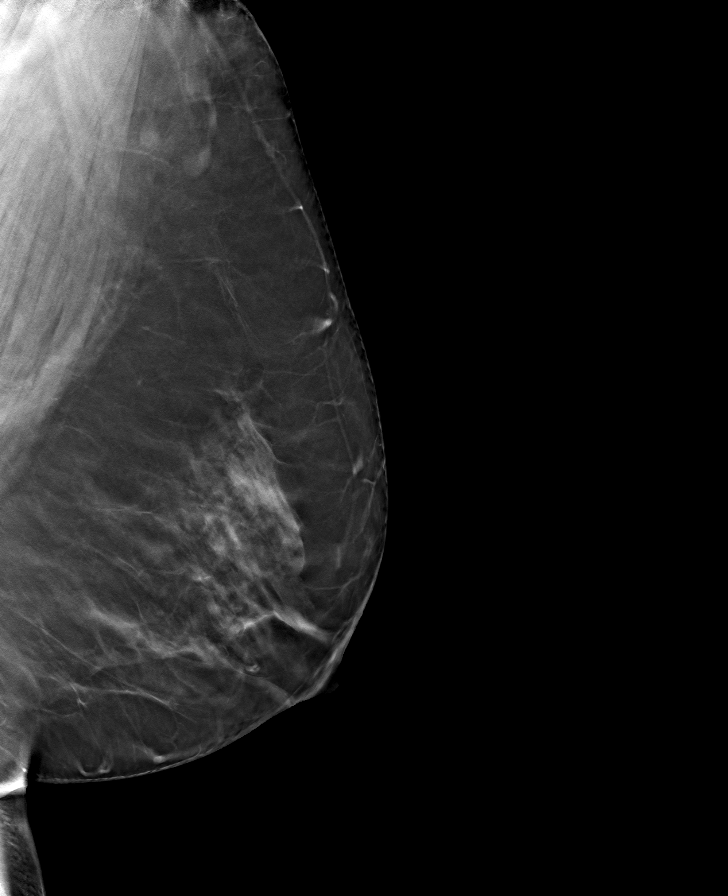

[L CC tomo · tomo slice 45/90.0]
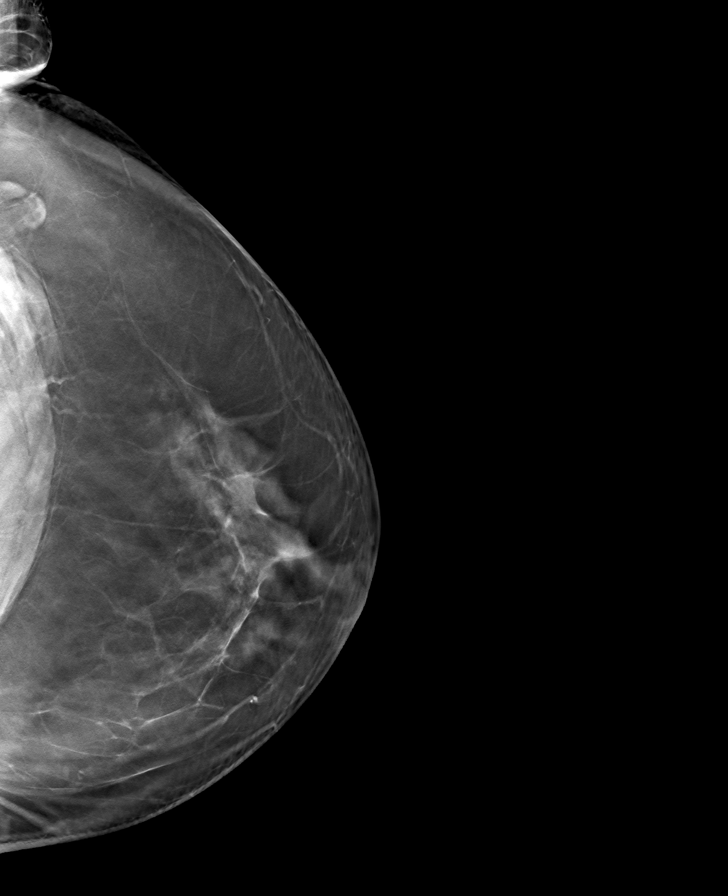

[8 of 24 positions shown; findings below may reference images not displayed]

ACR Breast Density Category b: There are scattered areas of
fibroglandular density.
FINDINGS: There are no findings suspicious for malignancy. Images were
processed with CAD.
IMPRESSION: No mammographic evidence of malignancy. A result letter of this
screening mammogram will be mailed directly to the patient.

RECOMMENDATION:
Screening mammogram in one year. (Code:[id number])

BI-RADS CATEGORY  1: Negative.

## 2022-02-07 ENCOUNTER — Telehealth: Payer: BC Managed Care – PPO | Admitting: Emergency Medicine

## 2022-02-07 DIAGNOSIS — U071 COVID-19: Secondary | ICD-10-CM | POA: Diagnosis not present

## 2022-02-07 MED ORDER — BENZONATATE 100 MG PO CAPS: 100.0000 mg | ORAL_CAPSULE | Freq: Two times a day (BID) | ORAL | 0 refills | Status: DC | PRN

## 2022-02-07 MED ORDER — NIRMATRELVIR/RITONAVIR (PAXLOVID)TABLET: 3.0000 | ORAL_TABLET | Freq: Two times a day (BID) | ORAL | 0 refills | Status: AC

## 2022-02-07 NOTE — Patient Instructions (Signed)
  Carolann Littler, thank you for joining Montine Circle, PA-C for today's virtual visit.  While this provider is not your primary care provider (PCP), if your PCP is located in our provider database this encounter information will be shared with them immediately following your visit.   [facility] account gives you access to today's visit and all your visits, tests, and labs performed at [facility] " click here if you don't have a [facility] account or go to [email or web address]  Consent: (Patient) Donna Shepard provided verbal consent for this virtual visit at the beginning of the encounter.  Current Medications:  Current Outpatient Medications:    benzonatate (TESSALON) 100 MG capsule, Take 1 capsule (100 mg total) by mouth 2 (two) times daily as needed for cough., Disp: 20 capsule, Rfl: 0   nirmatrelvir/ritonavir (PAXLOVID) 20 x 150 MG & 10 x '100MG'$  TABS, Take 3 tablets by mouth 2 (two) times daily for 5 days. (Take nirmatrelvir 150 mg two tablets twice daily for 5 days and ritonavir 100 mg one tablet twice daily for 5 days) Patient GFR is 70, Disp: 30 tablet, Rfl: 0   escitalopram (LEXAPRO) 10 MG tablet, Take 1 tablet (10 mg total) by mouth daily., Disp: 90 tablet, Rfl: 3   omeprazole (PRILOSEC) 40 MG capsule, Take 1 capsule (40 mg total) by mouth daily before breakfast., Disp: 90 capsule, Rfl: 3   Medications ordered in this encounter:  Meds ordered this encounter  Medications   nirmatrelvir/ritonavir (PAXLOVID) 20 x 150 MG & 10 x '100MG'$  TABS    Sig: Take 3 tablets by mouth 2 (two) times daily for 5 days. (Take nirmatrelvir 150 mg two tablets twice daily for 5 days and ritonavir 100 mg one tablet twice daily for 5 days) Patient GFR is 70    Dispense:  30 tablet    Refill:  0    Order Specific Question:   Supervising Provider    Answer:   Chase Picket [[id number]]   benzonatate (TESSALON) 100 MG capsule    Sig: Take 1 capsule (100 mg total) by  mouth 2 (two) times daily as needed for cough.    Dispense:  20 capsule    Refill:  0    Order Specific Question:   Supervising Provider    Answer:   Chase Picket [id number]     *If you need refills on other medications prior to your next appointment, please contact your pharmacy*  Follow-Up: Call back or seek an in-person evaluation if the symptoms worsen or if the condition fails to improve as anticipated.  [place] [phone]  Other Instructions    If you have been instructed to have an in-person evaluation today at a local Urgent Care facility, please use the link below. It will take you to a list of all of our available [facility], including address, phone number and hours of operation. Please do not delay care.  [facility]  If you or a family member do not have a primary care provider, use the link below to schedule a visit and establish care. When you choose a [facility] primary care physician or advanced practice provider, you gain a long-term partner in health. Find a Primary Care Provider  Learn more about [facility] in-office and virtual care options: [facility]

## 2022-02-07 NOTE — Progress Notes (Signed)
Virtual Visit Consent   Donna Shepard, you are scheduled for a virtual visit with a [facility] provider today. Just as with appointments in the office, your consent must be obtained to participate. Your consent will be active for this visit and any virtual visit you may have with one of our providers in the next 365 days. If you have a MyChart account, a copy of this consent can be sent to you electronically.  As this is a virtual visit, video technology does not allow for your provider to perform a traditional examination. This may limit your provider's ability to fully assess your condition. If your provider identifies any concerns that need to be evaluated in person or the need to arrange testing (such as labs, EKG, etc.), we will make arrangements to do so. Although advances in technology are sophisticated, we cannot ensure that it will always work on either your end or our end. If the connection with a video visit is poor, the visit may have to be switched to a telephone visit. With either a video or telephone visit, we are not always able to ensure that we have a secure connection.  By engaging in this virtual visit, you consent to the provision of healthcare and authorize for your insurance to be billed (if applicable) for the services provided during this visit. Depending on your insurance coverage, you may receive a charge related to this service.  I need to obtain your verbal consent now. Are you willing to proceed with your visit today? Donna Shepard has provided verbal consent on 02/07/2022 for a virtual visit (video or telephone). Montine Circle, PA-C  Date: 02/07/2022 9:13 AM  Virtual Visit via Video Note   I, Montine Circle, connected with  Donna Shepard  ([id number], 11/19/1965) on 02/07/22 at  9:15 AM EST by a video-enabled telemedicine application and verified that I am speaking with the correct person using two identifiers.  Location: Patient: Virtual Visit Location  Patient: Home Provider: Virtual Visit Location Provider: Home Office   I discussed the limitations of evaluation and management by telemedicine and the availability of in person appointments. The patient expressed understanding and agreed to proceed.    History of Present Illness: Donna Shepard is a 56 y.o. who identifies as a female who was assigned female at birth, and is being seen today for COVID.  Tested positive this morning.  Has had sick family members with the same.  States that she began feeling bad yesterday.  She states that she feels fatigued, mild cough, chest congestion.  States that she has been taking Tylenol.  She's not certain if she has had a fever.  HPI: HPI  Problems:  Patient Active Problem List   Diagnosis Date Noted   GERD (gastroesophageal reflux disease) 11/08/2021   Encounter for annual physical exam 11/06/2020   Vaccine counseling 78/29/5621   Eosinophilic esophagitis 30/86/5784   Mild depression 09/29/2020   Vitamin D deficiency 11/28/2016    Allergies:  Allergies  Allergen Reactions   Penicillins Hives and Rash    Did it involve swelling of the face/tongue/throat, SOB, or low BP? No Did it involve sudden or severe rash/hives, skin peeling, or any reaction on the inside of your mouth or nose? Yes Did you need to seek medical attention at a hospital or doctor's office? Yes When did it last happen?      5 years If all above answers are "NO", may proceed with cephalosporin use.    Medications:  Current Outpatient  Medications:    escitalopram (LEXAPRO) 10 MG tablet, Take 1 tablet (10 mg total) by mouth daily., Disp: 90 tablet, Rfl: 3   omeprazole (PRILOSEC) 40 MG capsule, Take 1 capsule (40 mg total) by mouth daily before breakfast., Disp: 90 capsule, Rfl: 3  Observations/Objective: Patient is well-developed, well-nourished in no acute distress.  Resting comfortably  at home.  Head is normocephalic, atraumatic.  No labored breathing.  Speech is  clear and coherent with logical content.  Patient is alert and oriented at baseline.    Assessment and Plan: 1. COVID-19  Meds ordered this encounter  Medications   nirmatrelvir/ritonavir (PAXLOVID) 20 x 150 MG & 10 x '100MG'$  TABS    Sig: Take 3 tablets by mouth 2 (two) times daily for 5 days. (Take nirmatrelvir 150 mg two tablets twice daily for 5 days and ritonavir 100 mg one tablet twice daily for 5 days) Patient GFR is 70    Dispense:  30 tablet    Refill:  0    Order Specific Question:   Supervising Provider    Answer:   Chase Picket [[id number]]   benzonatate (TESSALON) 100 MG capsule    Sig: Take 1 capsule (100 mg total) by mouth 2 (two) times daily as needed for cough.    Dispense:  20 capsule    Refill:  0    Order Specific Question:   Supervising Provider    Answer:   Chase Picket [id number]   Will trial paxlovid and Tessalon.  Last GFR was 70 in the fall of 2023.  No reported kidney or liver problems.  She doesn't take a statin or a DOAC.   Follow Up Instructions: I discussed the assessment and treatment plan with the patient. The patient was provided an opportunity to ask questions and all were answered. The patient agreed with the plan and demonstrated an understanding of the instructions.  A copy of instructions were sent to the patient via MyChart unless otherwise noted below.     The patient was advised to call back or seek an in-person evaluation if the symptoms worsen or if the condition fails to improve as anticipated.  Time:  I spent 12 minutes with the patient via telehealth technology discussing the above problems/concerns.    Montine Circle, PA-C

## 2022-05-08 ENCOUNTER — Encounter: Payer: Self-pay | Admitting: Family Medicine

## 2022-05-08 ENCOUNTER — Telehealth: Payer: BC Managed Care – PPO | Admitting: Family Medicine

## 2022-05-08 VITALS — Ht 65.0 in | Wt 235.0 lb

## 2022-05-08 DIAGNOSIS — J302 Other seasonal allergic rhinitis: Secondary | ICD-10-CM | POA: Diagnosis not present

## 2022-05-08 NOTE — Progress Notes (Signed)
Start time: 1:27 End time:  1:47  Virtual Visit via Video Note  I connected with Donna Shepard on 05/08/22 by a video enabled telemedicine application and verified that I am speaking with the correct person using two identifiers.  She was able to see/hear me. Her connection wasn't good enough to allow video; I was not able to see her.  Location: Patient: office in [place] Provider: office   I discussed the limitations of evaluation and management by telemedicine and the availability of in person appointments. The patient expressed understanding and agreed to proceed.  History of Present Illness:  Chief Complaint  Patient presents with   Allergies    VIRTUAL started with allergies around 04/21/22. Is now having a lot of drainage and cough, crusty eyes and some yellow mucus. Laryngitis. Main concern is that has been lingering for so long. Home covid test that was negative.    3/10 she went to [place]. Lots of pollen, and allergies started flaring--sore throat, itchy eyes, sneezing.  She started taking zyrtec the following day, taking daily since then. She has been active since then (working/exercising).  Over the last few days, she has had a productive cough, slightly tan (no green). This is early in the morning, and in evening before bed.  No coughing at night.  Last week she had some trouble breathing due to nasal congestion. At the end of the day, for the last few days, she has some hoarseness, her daughters noted change in voice, told her to go to the doctor.   Nose feels dry; has had some clear runny nose occasionally.  Notes some PND. Some drainage feeling in her ears this morning after her morning hot shower.  Denies wheezing, shortness of breath.  Concerned about her duration of symptoms, without resolution. Eyes were just slightly crusty this morning.  Not currently itchy, but when outside they are more irritated, itchy.     PMH, PSH, SH reviewed  Outpatient Encounter  Medications as of 05/08/2022  Medication Sig Note   acetaminophen (TYLENOL) 500 MG tablet Take 500 mg by mouth every 6 (six) hours as needed. 05/08/2022: Took one at noon   cetirizine (ZYRTEC) 10 MG chewable tablet Chew 10 mg by mouth daily. 05/08/2022: Liqui-gels    escitalopram (LEXAPRO) 10 MG tablet Take 1 tablet (10 mg total) by mouth daily.    omeprazole (PRILOSEC) 40 MG capsule Take 1 capsule (40 mg total) by mouth daily before breakfast.    [DISCONTINUED] benzonatate (TESSALON) 100 MG capsule Take 1 capsule (100 mg total) by mouth 2 (two) times daily as needed for cough.    No facility-administered encounter medications on file as of 05/08/2022.   Allergies  Allergen Reactions   Penicillins Hives and Rash    Did it involve swelling of the face/tongue/throat, SOB, or low BP? No Did it involve sudden or severe rash/hives, skin peeling, or any reaction on the inside of your mouth or nose? Yes Did you need to seek medical attention at a hospital or doctor's office? Yes When did it last happen?      5 years If all above answers are "NO", may proceed with cephalosporin use.    ROS: no f/c, no n/v/d. No chest pain, shortness of breath. No sick contacts. URI/allergy symptoms per HPI. Denies significant eye crusting or redness, just intermittently.      Observations/Objective:  Ht 5\' 5"  (1.651 m)   Wt 235 lb (106.6 kg)   LMP 02/20/2018 (Approximate)   BMI 39.11  kg/m   I wasn't able to see her on video. She is alert and oriented, normal speech.   Voice sounds normal, possibly slightly hoarse. No sniffling, throat-clearing or coughing during visit. She is speaking comfortably, in no distress Exam is limited due to the virtual nature of the visit.  Assessment and Plan:  Seasonal allergies - Ddx of her sx reviewed, and rec treatment with flonase, cont zyrtec, mucinex prn. S/sx of bacterial infection reviewed. F/u if worsening  Stay well hydrated. Continue to use Zyrtec  daily. Add in a decongestant if you start to have any sinus pain, or severe nasal congestion (if no high blood pressure or palpitations). Start using Flonase daily--2 sprays into each nostril, with gentle sniffs. You eventually, when symptoms are better, can either drop the zyrtec (use just when outside or when needed), or decrease the flonase to just 1 spray into each nostril, until the season ends. If you develop increasing sinus pain and discolored mucus, this could indicate a sinus infection and need an antibiotic. If your mucus is thick, then consider using guaifenesin  (plain mucinex). If you have a lot of coughing, you can use the DM version (dextromethorphan is a cough suppressant).    Follow Up Instructions:    I discussed the assessment and treatment plan with the patient. The patient was provided an opportunity to ask questions and all were answered. The patient agreed with the plan and demonstrated an understanding of the instructions.   The patient was advised to call back or seek an in-person evaluation if the symptoms worsen or if the condition fails to improve as anticipated.  I spent 24 minutes dedicated to the care of this patient, including pre-visit review of records, face to face time, post-visit ordering of testing and documentation.    Vikki Ports, MD

## 2022-05-08 NOTE — Patient Instructions (Signed)
Stay well hydrated. Continue to use Zyrtec daily. Add in a decongestant if you start to have any sinus pain, or severe nasal congestion (if no high blood pressure or palpitations). Start using Flonase daily--2 sprays into each nostril, with gentle sniffs. You eventually, when symptoms are better, can either drop the zyrtec (use just when outside or when needed), or decrease the flonase to just 1 spray into each nostril, until the season ends. If you develop increasing sinus pain and discolored mucus, this could indicate a sinus infection and need an antibiotic. If your mucus is thick, then consider using guaifenesin  (plain mucinex). If you have a lot of coughing, you can use the DM version (dextromethorphan is a cough suppressant).

## 2022-05-09 DIAGNOSIS — Z1211 Encounter for screening for malignant neoplasm of colon: Secondary | ICD-10-CM | POA: Diagnosis not present

## 2022-05-09 DIAGNOSIS — K200 Eosinophilic esophagitis: Secondary | ICD-10-CM | POA: Diagnosis not present

## 2022-05-09 DIAGNOSIS — K219 Gastro-esophageal reflux disease without esophagitis: Secondary | ICD-10-CM | POA: Diagnosis not present

## 2022-05-09 DIAGNOSIS — R635 Abnormal weight gain: Secondary | ICD-10-CM | POA: Diagnosis not present

## 2022-07-22 DIAGNOSIS — D120 Benign neoplasm of cecum: Secondary | ICD-10-CM | POA: Diagnosis not present

## 2022-07-22 DIAGNOSIS — K635 Polyp of colon: Secondary | ICD-10-CM | POA: Diagnosis not present

## 2022-07-22 DIAGNOSIS — Z1211 Encounter for screening for malignant neoplasm of colon: Secondary | ICD-10-CM | POA: Diagnosis not present

## 2022-07-25 ENCOUNTER — Encounter (HOSPITAL_BASED_OUTPATIENT_CLINIC_OR_DEPARTMENT_OTHER): Payer: Self-pay | Admitting: *Deleted

## 2022-09-24 DIAGNOSIS — D229 Melanocytic nevi, unspecified: Secondary | ICD-10-CM | POA: Diagnosis not present

## 2022-09-24 DIAGNOSIS — L814 Other melanin hyperpigmentation: Secondary | ICD-10-CM | POA: Diagnosis not present

## 2022-09-24 DIAGNOSIS — L578 Other skin changes due to chronic exposure to nonionizing radiation: Secondary | ICD-10-CM | POA: Diagnosis not present

## 2022-09-24 DIAGNOSIS — L821 Other seborrheic keratosis: Secondary | ICD-10-CM | POA: Diagnosis not present

## 2022-09-26 ENCOUNTER — Encounter: Payer: Self-pay | Admitting: Nurse Practitioner

## 2022-10-01 ENCOUNTER — Other Ambulatory Visit: Payer: Self-pay

## 2022-10-01 MED ORDER — SCOPOLAMINE 1 MG/3DAYS TD PT72: 1.0000 | MEDICATED_PATCH | TRANSDERMAL | 1 refills | Status: DC

## 2022-10-15 ENCOUNTER — Other Ambulatory Visit (HOSPITAL_BASED_OUTPATIENT_CLINIC_OR_DEPARTMENT_OTHER): Payer: Self-pay

## 2022-10-15 MED ORDER — FLULAVAL 0.5 ML IM SUSY
0.5000 mL | PREFILLED_SYRINGE | Freq: Once | INTRAMUSCULAR | 0 refills | Status: AC
  Filled 2022-10-15: qty 0.5, 1d supply, fill #0

## 2022-10-15 MED ORDER — BOOSTRIX 5-2.5-18.5 LF-MCG/0.5 IM SUSY
0.5000 mL | PREFILLED_SYRINGE | Freq: Once | INTRAMUSCULAR | 0 refills | Status: AC
  Filled 2022-10-15: qty 0.5, 1d supply, fill #0

## 2022-11-10 ENCOUNTER — Other Ambulatory Visit (HOSPITAL_BASED_OUTPATIENT_CLINIC_OR_DEPARTMENT_OTHER): Payer: Self-pay | Admitting: Nurse Practitioner

## 2022-11-10 DIAGNOSIS — Z0000 Encounter for general adult medical examination without abnormal findings: Secondary | ICD-10-CM

## 2022-11-10 DIAGNOSIS — K200 Eosinophilic esophagitis: Secondary | ICD-10-CM

## 2022-11-10 DIAGNOSIS — F32A Depression, unspecified: Secondary | ICD-10-CM

## 2022-11-12 ENCOUNTER — Ambulatory Visit (INDEPENDENT_AMBULATORY_CARE_PROVIDER_SITE_OTHER): Payer: BC Managed Care – PPO | Admitting: Nurse Practitioner

## 2022-11-12 ENCOUNTER — Encounter: Payer: Self-pay | Admitting: Nurse Practitioner

## 2022-11-12 VITALS — BP 122/80 | HR 76 | Ht 65.5 in | Wt 253.8 lb

## 2022-11-12 DIAGNOSIS — E782 Mixed hyperlipidemia: Secondary | ICD-10-CM | POA: Insufficient documentation

## 2022-11-12 DIAGNOSIS — K219 Gastro-esophageal reflux disease without esophagitis: Secondary | ICD-10-CM | POA: Diagnosis not present

## 2022-11-12 DIAGNOSIS — J0120 Acute ethmoidal sinusitis, unspecified: Secondary | ICD-10-CM

## 2022-11-12 DIAGNOSIS — F32A Depression, unspecified: Secondary | ICD-10-CM

## 2022-11-12 DIAGNOSIS — K200 Eosinophilic esophagitis: Secondary | ICD-10-CM

## 2022-11-12 DIAGNOSIS — E559 Vitamin D deficiency, unspecified: Secondary | ICD-10-CM | POA: Diagnosis not present

## 2022-11-12 DIAGNOSIS — Z0000 Encounter for general adult medical examination without abnormal findings: Secondary | ICD-10-CM

## 2022-11-12 HISTORY — DX: Acute ethmoidal sinusitis, unspecified: J01.20

## 2022-11-12 MED ORDER — AZITHROMYCIN 250 MG PO TABS: ORAL_TABLET | ORAL | 0 refills | Status: AC

## 2022-11-12 MED ORDER — LEVOCETIRIZINE DIHYDROCHLORIDE 2.5 MG/5ML PO SOLN: 5.0000 mg | Freq: Every evening | ORAL | 1 refills | Status: DC

## 2022-11-12 NOTE — Progress Notes (Signed)
Shawna Clamp, DNP, AGNP-c [facility] [address] [place], [place] [address] Main Office [phone]  BP 122/80   Pulse 76   Ht 5' 5.5" (1.664 m)   Wt 253 lb 12.8 oz (115.1 kg)   LMP 02/20/2018 (Approximate)   BMI 41.59 kg/m    Subjective:    Patient ID: Donna Shepard, female    DOB: October 10, 1965, 57 y.o.   MRN: [id number]  HPI: Donna Shepard is a 57 y.o. female presenting on 11/12/2022 for comprehensive medical examination.   History of Present Illness   [name], a 57 year old individual, presents for an annual physical exam. She recently returned from a trip to [place], [place], and [place], which she enjoyed immensely. However, upon returning, she developed symptoms suggestive of a common cold, including an itchy throat and sneezing. The onset of these symptoms was approximately a week ago, shortly after her return. The patient has been managing these symptoms with rest and fluids, but notes a persistent feeling of fullness in her throat and congestion.   Kaytelyn also reports occasional swelling in her feet and ankles, which typically resolves by morning. This swelling seems to be exacerbated by heat and high-salt foods, particularly during a recent summer trip.  Pertinent items are noted in HPI.  IMMUNIZATIONS:   Flu Vaccine: Flu vaccine completed elsewhere this season. Record updated. Prevnar 13: Prevnar 13 N/A for this patient Prevnar 20: Prevnar 20 N/A for this patient Pneumovax 23: Pneumovax 23 N/A for this patient Vac Shingrix: Shingrix both doses completed, documentation in chart HPV: N/A or Aged Out Tetanus: Tetanus completed in the last 10 years COVID: Declined today. Information on where to obtain the vaccine was provided.  RSV: No  HEALTH MAINTENANCE: Pap Smear HM Status: no longer needed Mammogram HM Status: is due and will be scheduled by patient in the near future Colon Cancer Screening HM Status: is up to date Bone Density HM  Status: N/A STI Testing HM Status: was declined  Lung CT HM Status: N/A  Concerns with vision, hearing, or dentition: No  Most Recent Depression Screen:     11/12/2022    8:08 AM 11/12/2022    8:07 AM 11/08/2021    7:13 PM 11/06/2020   10:41 AM 09/27/2020    2:48 PM  Depression screen PHQ 2/9  Decreased Interest 0 0 0 0 1  Down, Depressed, Hopeless 0 0 0 1 1  PHQ - 2 Score 0 0 0 1 2  Altered sleeping 1   0 1  Tired, decreased energy 0   1 1  Change in appetite 0   1 1  Feeling bad or failure about yourself  0   0 1  Trouble concentrating 0   0 1  Moving slowly or fidgety/restless 0   0 1  Suicidal thoughts 0   0 0  PHQ-9 Score 1   3 8   Difficult doing work/chores Not difficult at all   Not difficult at all    Most Recent Anxiety Screen:     11/12/2022    8:08 AM 11/06/2020   10:41 AM  GAD 7 : Generalized Anxiety Score  Nervous, Anxious, on Edge 0 0  Control/stop worrying 0 0  Worry too much - different things 0 0  Trouble relaxing 0 0  Restless 0 0  Easily annoyed or irritable 0 0  Afraid - awful might happen 0 0  Total GAD 7 Score 0 0  Anxiety Difficulty Not difficult at all    Most  Recent Fall Screen:    11/12/2022    8:07 AM 11/08/2021    9:42 AM 11/06/2020   10:40 AM  Fall Risk   Falls in the past year? 0 0 0  Number falls in past yr: 0 0 0  Injury with Fall? 0 0 0  Risk for fall due to : No Fall Risks No Fall Risks No Fall Risks  Follow up Falls evaluation completed Education provided;Falls evaluation completed Falls evaluation completed    Past medical history, surgical history, medications, allergies, family history and social history reviewed with patient today and changes made to appropriate areas of the chart.  Past Medical History:  Past Medical History:  Diagnosis Date   Complex atypical endometrial hyperplasia 01/2019   s/p TLH/BSO, sentinel node biopsy   Esophagitis 06/2015   after 06/2015 endoscopy    Major depressive disorder, single episode,  mild (HCC) 11/08/2021   Migraines    no aura   Syncopal episodes    situational   Medications:  Current Outpatient Medications on File Prior to Visit  Medication Sig   acetaminophen (TYLENOL) 500 MG tablet Take 500 mg by mouth every 6 (six) hours as needed.   cetirizine (ZYRTEC) 10 MG chewable tablet Chew 10 mg by mouth daily.   escitalopram (LEXAPRO) 10 MG tablet TAKE 1 TABLET BY MOUTH EVERY DAY   omeprazole (PRILOSEC) 40 MG capsule TAKE 1 CAPSULE BY MOUTH EVERY DAY BEFORE BREAKFAST   scopolamine (TRANSDERM-SCOP) 1 MG/3DAYS Place 1 patch (1.5 mg total) onto the skin every 3 (three) days.   No current facility-administered medications on file prior to visit.   Surgical History:  Past Surgical History:  Procedure Laterality Date   COLONOSCOPY  2017   ROBOTIC ASSISTED TOTAL HYSTERECTOMY WITH BILATERAL SALPINGO OOPHERECTOMY Bilateral 03/31/2019   Procedure: XI ROBOTIC ASSISTED TOTAL HYSTERECTOMY WITH BILATERAL SALPINGO OOPHORECTOMY;  Surgeon: Carver Fila, MD;  [facility];  Service: Gynecology;  Laterality: Bilateral;  BED HELD PATIENT TO BE DISCHARGED TODAY PER SCHEDULER   SENTINEL NODE BIOPSY N/A 03/31/2019   Procedure: SENTINEL NODE BIOPSY;  Surgeon: Carver Fila, MD;  Location: [facility];  Service: Gynecology;  Laterality: N/A;   Allergies:  Allergies  Allergen Reactions   Penicillins Hives and Rash    Did it involve swelling of the face/tongue/throat, SOB, or low BP? No Did it involve sudden or severe rash/hives, skin peeling, or any reaction on the inside of your mouth or nose? Yes Did you need to seek medical attention at a hospital or doctor's office? Yes When did it last happen?      5 years If all above answers are "NO", may proceed with cephalosporin use.    Family History:  Family History  Problem Relation Age of Onset   Hypertension Mother    Depression Mother    Hypertension Father    Depression Father     Depression Brother    Hypertension Maternal Grandmother    Breast cancer Neg Hx    Ovarian cancer Neg Hx    Endometrial cancer Neg Hx        Objective:    BP 122/80   Pulse 76   Ht 5' 5.5" (1.664 m)   Wt 253 lb 12.8 oz (115.1 kg)   LMP 02/20/2018 (Approximate)   BMI 41.59 kg/m   Wt Readings from Last 3 Encounters:  11/12/22 253 lb 12.8 oz (115.1 kg)  05/08/22 235 lb (106.6 kg)  11/08/21 248 lb (  112.5 kg)    Physical Exam Vitals and nursing note reviewed.  Constitutional:      General: She is not in acute distress.    Appearance: Normal appearance.  HENT:     Head: Normocephalic and atraumatic.     Right Ear: Hearing, tympanic membrane, ear canal and external ear normal.     Left Ear: Hearing, tympanic membrane, ear canal and external ear normal.     Nose: Congestion and rhinorrhea present.     Right Sinus: No maxillary sinus tenderness or frontal sinus tenderness.     Left Sinus: No maxillary sinus tenderness or frontal sinus tenderness.     Mouth/Throat:     Lips: Pink.     Mouth: Mucous membranes are moist.     Pharynx: Oropharynx is clear.  Eyes:     General: Lids are normal. Vision grossly intact.     Extraocular Movements: Extraocular movements intact.     Conjunctiva/sclera: Conjunctivae normal.     Pupils: Pupils are equal, round, and reactive to light.     Funduscopic exam:    Right eye: Red reflex present.        Left eye: Red reflex present.    Visual Fields: Right eye visual fields normal and left eye visual fields normal.  Neck:     Thyroid: No thyromegaly.     Vascular: No carotid bruit.  Cardiovascular:     Rate and Rhythm: Normal rate and regular rhythm.     Chest Wall: PMI is not displaced.     Pulses: Normal pulses.          Dorsalis pedis pulses are 2+ on the right side and 2+ on the left side.       Posterior tibial pulses are 2+ on the right side and 2+ on the left side.     Heart sounds: Normal heart sounds. No murmur heard. Pulmonary:      Effort: Pulmonary effort is normal. No respiratory distress.     Breath sounds: Normal breath sounds.  Abdominal:     General: Abdomen is flat. Bowel sounds are normal. There is no distension.     Palpations: Abdomen is soft. There is no hepatomegaly, splenomegaly or mass.     Tenderness: There is no abdominal tenderness. There is no right CVA tenderness, left CVA tenderness, guarding or rebound.  Musculoskeletal:        General: Normal range of motion.     Cervical back: Full passive range of motion without pain, normal range of motion and neck supple. No tenderness.     Right lower leg: No edema.     Left lower leg: No edema.  Feet:     Left foot:     Toenail Condition: Left toenails are normal.  Lymphadenopathy:     Cervical: Cervical adenopathy present.     Upper Body:     Right upper body: No supraclavicular adenopathy.     Left upper body: No supraclavicular adenopathy.  Skin:    General: Skin is warm and dry.     Capillary Refill: Capillary refill takes less than 2 seconds.     Nails: There is no clubbing.  Neurological:     General: No focal deficit present.     Mental Status: She is alert and oriented to person, place, and time.     GCS: GCS eye subscore is 4. GCS verbal subscore is 5. GCS motor subscore is 6.     Sensory: Sensation is intact.  Motor: Motor function is intact.     Coordination: Coordination is intact.     Gait: Gait is intact.     Deep Tendon Reflexes: Reflexes are normal and symmetric.  Psychiatric:        Attention and Perception: Attention normal.        Mood and Affect: Mood normal.        Speech: Speech normal.        Behavior: Behavior normal. Behavior is cooperative.        Thought Content: Thought content normal.        Cognition and Memory: Cognition and memory normal.        Judgment: Judgment normal.     Results for orders placed or performed in visit on 11/08/21  CBC with Differential/Platelet  Result Value Ref Range   WBC 7.0  3.4 - 10.8 x10E3/uL   RBC 4.77 3.77 - 5.28 x10E6/uL   Hemoglobin 12.8 11.1 - 15.9 g/dL   Hematocrit 98.1 19.1 - 46.6 %   MCV 83 79 - 97 fL   MCH 26.8 26.6 - 33.0 pg   MCHC 32.2 31.5 - 35.7 g/dL   RDW 47.8 29.5 - 62.1 %   Platelets 345 150 - 450 x10E3/uL   Neutrophils 63 Not Estab. %   Lymphs 25 Not Estab. %   Monocytes 8 Not Estab. %   Eos 3 Not Estab. %   Basos 1 Not Estab. %   Neutrophils Absolute 4.4 1.4 - 7.0 x10E3/uL   Lymphocytes Absolute 1.8 0.7 - 3.1 x10E3/uL   Monocytes Absolute 0.5 0.1 - 0.9 x10E3/uL   EOS (ABSOLUTE) 0.2 0.0 - 0.4 x10E3/uL   Basophils Absolute 0.1 0.0 - 0.2 x10E3/uL   Immature Granulocytes 0 Not Estab. %   Immature Grans (Abs) 0.0 0.0 - 0.1 x10E3/uL  Comprehensive metabolic panel  Result Value Ref Range   Glucose 78 70 - 99 mg/dL   BUN 11 6 - 24 mg/dL   Creatinine, Ser 3.08 0.57 - 1.00 mg/dL   eGFR 70 >65 HQ/ION/6.29   BUN/Creatinine Ratio 12 9 - 23   Sodium 138 134 - 144 mmol/L   Potassium 4.7 3.5 - 5.2 mmol/L   Chloride 99 96 - 106 mmol/L   CO2 23 20 - 29 mmol/L   Calcium 9.5 8.7 - 10.2 mg/dL   Total Protein 6.9 6.0 - 8.5 g/dL   Albumin 4.4 3.8 - 4.9 g/dL   Globulin, Total 2.5 1.5 - 4.5 g/dL   Albumin/Globulin Ratio 1.8 1.2 - 2.2   Bilirubin Total 0.3 0.0 - 1.2 mg/dL   Alkaline Phosphatase 125 (H) 44 - 121 IU/L   AST 21 0 - 40 IU/L   ALT 19 0 - 32 IU/L  Lipid panel  Result Value Ref Range   Cholesterol, Total 178 100 - 199 mg/dL   Triglycerides 528 0 - 149 mg/dL   HDL 55 >41 mg/dL   VLDL Cholesterol Cal 22 5 - 40 mg/dL   LDL Chol Calc (NIH) 324 (H) 0 - 99 mg/dL   Chol/HDL Ratio 3.2 0.0 - 4.4 ratio  Hemoglobin A1c  Result Value Ref Range   Hgb A1c MFr Bld 5.5 4.8 - 5.6 %   Est. average glucose Bld gHb Est-mCnc 111 mg/dL  VITAMIN D 25 Hydroxy (Vit-D Deficiency, Fractures)  Result Value Ref Range   Vit D, 25-Hydroxy 29.0 (L) 30.0 - 100.0 ng/mL  TSH  Result Value Ref Range   TSH 3.130 0.450 - 4.500 uIU/mL  T4, free  Result Value Ref  Range   Free T4 1.11 0.82 - 1.77 ng/dL         Assessment & Plan:   Problem List Items Addressed This Visit     Vitamin D deficiency    Chronic. Repeat labs today for monitoring of therapy.       Relevant Orders   VITAMIN D 25 Hydroxy (Vit-D Deficiency, Fractures)   Mild depression    Well controlled on current regimen of escitalopram with no alarm symptoms present. PHQ-9 and GAD7 completed today.  -Continue current dose of escitalopram. -Refills may be sent as needed       Relevant Orders   Complete depression screening   Encounter for annual physical exam - Primary    CPE completed today. Review of HM activities and recommendations discussed and provided on AVS. Anticipatory guidance, diet, and exercise recommendations provided. Medications, allergies, and hx reviewed and updated as necessary. Orders placed as listed below.  Plan: - Labs ordered. Will make changes as necessary based on results.  - I will review these results and send recommendations via MyChart or a telephone call.  - F/U with CPE in 1 year or sooner for acute/chronic health needs as directed.        Relevant Orders   CBC with Differential/Platelet   CMP14+EGFR   Hemoglobin A1c   Lipid panel   Complete depression screening   GERD (gastroesophageal reflux disease)    Well controlled on Prilosec. -Continue Prilosec as needed.       Relevant Orders   CBC with Differential/Platelet   Moderate mixed hyperlipidemia not requiring statin therapy    Chronic. Controlled with diet and exercise regimen. No concerning symptoms present today. Labs pending. -Continue to monitor your diet and work to keep your saturated fat intake low.       Relevant Orders   CBC with Differential/Platelet   CMP14+EGFR   Hemoglobin A1c   Lipid panel   Acute non-recurrent ethmoidal sinusitis    Recent travel with exposure to sick individuals. Symptoms include itchy throat, congestion, sinus tenderness, and sneezing, with  no ear pain. Mildly swollen cervical lymph nodes noted. -Prescribe Azithromycin. -Prescribe Xyzal (levocetirizine) to help with allergy-like symptoms and to dry up sinus congestion. -If symptoms fail to improve, please contact the office for further recommendations.       Relevant Medications   azithromycin (ZITHROMAX) 250 MG tablet   levocetirizine (XYZAL) 2.5 MG/5ML solution   Eosinophilic esophagitis   Relevant Orders   CBC with Differential/Platelet    Follow up plan: Return in about 1 year (around 11/12/2023) for CPE.  NEXT PREVENTATIVE PHYSICAL DUE IN 1 YEAR.  Documentation partially completed with the use of AI Scribe software and Programmer, systems.    PATIENT COUNSELING PROVIDED FOR ALL ADULT PATIENTS: A well balanced diet low in saturated fats, cholesterol, and moderation in carbohydrates.  This can be as simple as monitoring portion sizes and cutting back on sugary beverages such as soda and juice to start with.    Daily water consumption of at least 64 ounces.  Physical activity at least 180 minutes per week.  If just starting out, start 10 minutes a day and work your way up.   This can be as simple as taking the stairs instead of the elevator and walking 2-3 laps around the office  purposefully every day.   STD protection, partner selection, and regular testing if high risk.  Limited consumption of alcoholic beverages  if alcohol is consumed. For men, I recommend no more than 14 alcoholic beverages per week, spread out throughout the week (max 2 per day). Avoid "binge" drinking or consuming large quantities of alcohol in one setting.  Please let me know if you feel you may need help with reduction or quitting alcohol consumption.   Avoidance of nicotine, if used. Please let me know if you feel you may need help with reduction or quitting nicotine use.   Daily mental health attention. This can be in the form of 5 minute daily meditation, prayer, journaling,  yoga, reflection, etc.  Purposeful attention to your emotions and mental state can significantly improve your overall wellbeing  and  Health.  Please know that I am here to help you with all of your health care goals and am happy to work with you to find a solution that works best for you.  The greatest advice I have received with any changes in life are to take it one step at a time, that even means if all you can focus on is the next 60 seconds, then do that and celebrate your victories.  With any changes in life, you will have set backs, and that is OK. The important thing to remember is, if you have a set back, it is not a failure, it is an opportunity to try again! Screening Testing Mammogram Every 1 -2 years based on history and risk factors Starting at age 75 Pap Smear Ages 21-39 every 3 years Ages 43-65 every 5 years with HPV testing More frequent testing may be required based on results and history Colon Cancer Screening Every 1-10 years based on test performed, risk factors, and history Starting at age 25 Bone Density Screening Every 2-10 years based on history Starting at age 73 for women Recommendations for men differ based on medication usage, history, and risk factors AAA Screening One time ultrasound Men 33-21 years old who have every smoked Lung Cancer Screening Low Dose Lung CT every 12 months Age 66-80 years with a 30 pack-year smoking history who still smoke or who have quit within the last 15 years   Screening Labs Routine  Labs: Complete Blood Count (CBC), Complete Metabolic Panel (CMP), Cholesterol (Lipid Panel) Every 6-12 months based on history and medications May be recommended more frequently based on current conditions or previous results Hemoglobin A1c Lab Every 3-12 months based on history and previous results Starting at age 50 or earlier with diagnosis of diabetes, high cholesterol, BMI >26, and/or risk factors Frequent monitoring for patients with  diabetes to ensure blood sugar control Thyroid Panel (TSH) Every 6 months based on history, symptoms, and risk factors May be repeated more often if on medication HIV One time testing for all patients 85 and older May be repeated more frequently for patients with increased risk factors or exposure Hepatitis C One time testing for all patients 33 and older May be repeated more frequently for patients with increased risk factors or exposure Gonorrhea, Chlamydia Every 12 months for all sexually active persons 13-24 years Additional monitoring may be recommended for those who are considered high risk or who have symptoms Every 12 months for any woman on birth control, regardless of sexual activity PSA Men 19-51 years old with risk factors Additional screening may be recommended from age 79-69 based on risk factors, symptoms, and history  Vaccine Recommendations Tetanus Booster All adults every 10 years Flu Vaccine All patients 6 months and older every year COVID Vaccine  All patients 12 years and older Initial dosing with booster May recommend additional booster based on age and health history HPV Vaccine 2 doses all patients age 67-26 Dosing may be considered for patients over 26 Shingles Vaccine (Shingrix) 2 doses all adults 55 years and older Pneumonia (Pneumovax 23) All adults 65 years and older May recommend earlier dosing based on health history One year apart from Prevnar 13 Pneumonia (Prevnar 21) All adults 65 years and older Dosed 1 year after Pneumovax 23 Pneumonia (Prevnar 20) One time alternative to the two dosing of 13 and 23 For all adults with initial dose of 23, 20 is recommended 1 year later For all adults with initial dose of 13, 23 is still recommended as second option 1 year later

## 2022-11-12 NOTE — Assessment & Plan Note (Signed)
Chronic. Controlled with diet and exercise regimen. No concerning symptoms present today. Labs pending. -Continue to monitor your diet and work to keep your saturated fat intake low.

## 2022-11-12 NOTE — Assessment & Plan Note (Signed)
Well controlled on current regimen of escitalopram with no alarm symptoms present. PHQ-9 and GAD7 completed today.  -Continue current dose of escitalopram. -Refills may be sent as needed

## 2022-11-12 NOTE — Patient Instructions (Addendum)
Everything looks fantastic today!   I am so excited about your trip! It sounds like it was amazing! You have inspired me!!   For all adult patients, I recommend A well balanced diet low in saturated fats, cholesterol, and moderation in carbohydrates.   This can be as simple as monitoring portion sizes and cutting back on sugary beverages such as soda and juice to start with.    Daily water consumption of at least 64 ounces.  Physical activity at least 180 minutes per week, if just starting out.   This can be as simple as taking the stairs instead of the elevator and walking 2-3 laps around the office  purposefully every day.   STD protection, partner selection, and regular testing if high risk.  Limited consumption of alcoholic beverages if alcohol is consumed.  For women, I recommend no more than 7 alcoholic beverages per week, spread out throughout the week.  Avoid "binge" drinking or consuming large quantities of alcohol in one setting.   Please let me know if you feel you may need help with reduction or quitting alcohol consumption.   Avoidance of nicotine, if used.  Please let me know if you feel you may need help with reduction or quitting nicotine use.   Daily mental health attention.  This can be in the form of 5 minute daily meditation, prayer, journaling, yoga, reflection, etc.   Purposeful attention to your emotions and mental state can significantly improve your overall wellbeing  and  Health.  Please know that I am here to help you with all of your health care goals and am happy to work with you to find a solution that works best for you.  The greatest advice I have received with any changes in life are to take it one step at a time, that even means if all you can focus on is the next 60 seconds, then do that and celebrate your victories.  With any changes in life, you will have set backs, and that is OK. The important thing to remember is, if you have a set back, it is not  a failure, it is an opportunity to try again!  Health Maintenance Recommendations Screening Testing Mammogram Every 1 -2 years based on history and risk factors Starting at age 30 Pap Smear Ages 21-39 every 3 years Ages 63-65 every 5 years with HPV testing More frequent testing may be required based on results and history Colon Cancer Screening Every 1-10 years based on test performed, risk factors, and history Starting at age 87 Bone Density Screening Every 2-10 years based on history Starting at age 22 for women Recommendations for men differ based on medication usage, history, and risk factors AAA Screening One time ultrasound Men 55-83 years old who have every smoked Lung Cancer Screening Low Dose Lung CT every 12 months Age 79-80 years with a 30 pack-year smoking history who still smoke or who have quit within the last 15 years  Screening Labs Routine  Labs: Complete Blood Count (CBC), Complete Metabolic Panel (CMP), Cholesterol (Lipid Panel) Every 6-12 months based on history and medications May be recommended more frequently based on current conditions or previous results Hemoglobin A1c Lab Every 3-12 months based on history and previous results Starting at age 68 or earlier with diagnosis of diabetes, high cholesterol, BMI >26, and/or risk factors Frequent monitoring for patients with diabetes to ensure blood sugar control Thyroid Panel (TSH w/ T3 & T4) Every 6 months based on history,  symptoms, and risk factors May be repeated more often if on medication HIV One time testing for all patients 67 and older May be repeated more frequently for patients with increased risk factors or exposure Hepatitis C One time testing for all patients 35 and older May be repeated more frequently for patients with increased risk factors or exposure Gonorrhea, Chlamydia Every 12 months for all sexually active persons 13-24 years Additional monitoring may be recommended for those who  are considered high risk or who have symptoms PSA Men 78-54 years old with risk factors Additional screening may be recommended from age 57-69 based on risk factors, symptoms, and history  Vaccine Recommendations Tetanus Booster All adults every 10 years Flu Vaccine All patients 6 months and older every year COVID Vaccine All patients 12 years and older Initial dosing with booster May recommend additional booster based on age and health history HPV Vaccine 2 doses all patients age 4-26 Dosing may be considered for patients over 26 Shingles Vaccine (Shingrix) 2 doses all adults 55 years and older Pneumonia (Pneumovax 23) All adults 65 years and older May recommend earlier dosing based on health history Pneumonia (Prevnar 11) All adults 65 years and older Dosed 1 year after Pneumovax 23  Additional Screening, Testing, and Vaccinations may be recommended on an individualized basis based on family history, health history, risk factors, and/or exposure.

## 2022-11-12 NOTE — Assessment & Plan Note (Signed)
Well controlled on Prilosec. -Continue Prilosec as needed.

## 2022-11-12 NOTE — Assessment & Plan Note (Signed)
Chronic. Repeat labs today for monitoring of therapy.

## 2022-11-12 NOTE — Assessment & Plan Note (Signed)
Recent travel with exposure to sick individuals. Symptoms include itchy throat, congestion, sinus tenderness, and sneezing, with no ear pain. Mildly swollen cervical lymph nodes noted. -Prescribe Azithromycin. -Prescribe Xyzal (levocetirizine) to help with allergy-like symptoms and to dry up sinus congestion. -If symptoms fail to improve, please contact the office for further recommendations.

## 2022-11-12 NOTE — Assessment & Plan Note (Signed)

## 2022-11-13 ENCOUNTER — Encounter (HOSPITAL_BASED_OUTPATIENT_CLINIC_OR_DEPARTMENT_OTHER): Payer: BC Managed Care – PPO | Admitting: Nurse Practitioner

## 2022-11-13 LAB — CMP14+EGFR
ALT: 23 IU/L (ref 0–32)
AST: 21 IU/L (ref 0–40)
Albumin: 4.2 g/dL (ref 3.8–4.9)
Alkaline Phosphatase: 120 IU/L (ref 44–121)
BUN/Creatinine Ratio: 10 (ref 9–23)
BUN: 10 mg/dL (ref 6–24)
Bilirubin Total: 0.3 mg/dL (ref 0.0–1.2)
CO2: 24 mmol/L (ref 20–29)
Calcium: 9.5 mg/dL (ref 8.7–10.2)
Chloride: 104 mmol/L (ref 96–106)
Creatinine, Ser: 1.01 mg/dL — ABNORMAL HIGH (ref 0.57–1.00)
Globulin, Total: 2.8 g/dL (ref 1.5–4.5)
Glucose: 92 mg/dL (ref 70–99)
Potassium: 4.9 mmol/L (ref 3.5–5.2)
Sodium: 141 mmol/L (ref 134–144)
Total Protein: 7 g/dL (ref 6.0–8.5)
eGFR: 65 mL/min/{1.73_m2} (ref >59)

## 2022-11-13 LAB — CBC WITH DIFFERENTIAL/PLATELET
Basophils Absolute: 0.1 10*3/uL (ref 0.0–0.2)
Basos: 1 %
EOS (ABSOLUTE): 0.4 10*3/uL (ref 0.0–0.4)
Eos: 7 %
Hematocrit: 44.3 % (ref 34.0–46.6)
Hemoglobin: 13.5 g/dL (ref 11.1–15.9)
Immature Grans (Abs): 0 10*3/uL (ref 0.0–0.1)
Immature Granulocytes: 0 %
Lymphocytes Absolute: 1.6 10*3/uL (ref 0.7–3.1)
Lymphs: 24 %
MCH: 26.1 pg — ABNORMAL LOW (ref 26.6–33.0)
MCHC: 30.5 g/dL — ABNORMAL LOW (ref 31.5–35.7)
MCV: 86 fL (ref 79–97)
Monocytes Absolute: 0.6 10*3/uL (ref 0.1–0.9)
Monocytes: 9 %
Neutrophils Absolute: 3.8 10*3/uL (ref 1.4–7.0)
Neutrophils: 59 %
Platelets: 355 10*3/uL (ref 150–450)
RBC: 5.17 x10E6/uL (ref 3.77–5.28)
RDW: 13.7 % (ref 11.7–15.4)
WBC: 6.4 10*3/uL (ref 3.4–10.8)

## 2022-11-13 LAB — LIPID PANEL
Cholesterol, Total: 177 mg/dL (ref 100–199)
HDL: 57 mg/dL (ref >39)
LDL CALC COMMENT:: 3.1 ratio (ref 0.0–4.4)
LDL Chol Calc (NIH): 106 mg/dL — ABNORMAL HIGH (ref 0–99)
Triglycerides: 73 mg/dL (ref 0–149)
VLDL Cholesterol Cal: 14 mg/dL (ref 5–40)

## 2022-11-13 LAB — HEMOGLOBIN A1C
Est. average glucose Bld gHb Est-mCnc: 114 mg/dL
Hgb A1c MFr Bld: 5.6 % (ref 4.8–5.6)

## 2022-11-13 LAB — VITAMIN D 25 HYDROXY (VIT D DEFICIENCY, FRACTURES): Vit D, 25-Hydroxy: 28.8 ng/mL — ABNORMAL LOW (ref 30.0–100.0)

## 2022-11-28 ENCOUNTER — Other Ambulatory Visit: Payer: Self-pay | Admitting: Nurse Practitioner

## 2022-11-28 DIAGNOSIS — R7989 Other specified abnormal findings of blood chemistry: Secondary | ICD-10-CM

## 2022-11-28 DIAGNOSIS — E559 Vitamin D deficiency, unspecified: Secondary | ICD-10-CM

## 2022-11-28 MED ORDER — VITAMIN D (ERGOCALCIFEROL) 1.25 MG (50000 UNIT) PO CAPS
50000.0000 [IU] | ORAL_CAPSULE | ORAL | 3 refills | Status: AC
Start: 2022-11-28 — End: ?

## 2022-12-04 ENCOUNTER — Other Ambulatory Visit: Payer: Self-pay | Admitting: Nurse Practitioner

## 2022-12-04 DIAGNOSIS — J0120 Acute ethmoidal sinusitis, unspecified: Secondary | ICD-10-CM

## 2023-05-13 ENCOUNTER — Other Ambulatory Visit (HOSPITAL_BASED_OUTPATIENT_CLINIC_OR_DEPARTMENT_OTHER): Payer: Self-pay | Admitting: Nurse Practitioner

## 2023-05-13 DIAGNOSIS — K200 Eosinophilic esophagitis: Secondary | ICD-10-CM

## 2023-05-13 DIAGNOSIS — F32A Depression, unspecified: Secondary | ICD-10-CM

## 2023-05-13 DIAGNOSIS — Z0000 Encounter for general adult medical examination without abnormal findings: Secondary | ICD-10-CM

## 2023-06-10 ENCOUNTER — Other Ambulatory Visit: Payer: Self-pay | Admitting: Nurse Practitioner

## 2023-06-10 DIAGNOSIS — Z1231 Encounter for screening mammogram for malignant neoplasm of breast: Secondary | ICD-10-CM

## 2023-06-13 ENCOUNTER — Ambulatory Visit
Admission: RE | Admit: 2023-06-13 | Discharge: 2023-06-13 | Disposition: A | Discharge status: Home / Self Care | Source: Ambulatory Visit | Attending: Nurse Practitioner | Admitting: Nurse Practitioner

## 2023-06-13 DIAGNOSIS — Z1231 Encounter for screening mammogram for malignant neoplasm of breast: Secondary | ICD-10-CM

## 2023-06-19 ENCOUNTER — Encounter: Payer: Self-pay | Admitting: Nurse Practitioner

## 2023-11-11 NOTE — Progress Notes (Unsigned)
 Flu: Covid: Pneumonia:   [name], DNP, AGNP-c [facility] [address] [place], [place] [address] Main Office [phone] VISIT TYPE: CPE on 11/14/2023 Today's Vitals   11/14/23 0944  BP: 122/80  Pulse: 65  Weight: 263 lb (119.3 kg)  Height: 5' 5 (1.651 m)   Body mass index is 43.77 kg/m. BP 122/80   Pulse 65   Ht 5' 5 (1.651 m)   Wt 263 lb (119.3 kg)   LMP 02/20/2018 (Approximate)   BMI 43.77 kg/m   Subjective:    Patient ID: Donna Shepard, female    DOB: Jun 17, 1965, 58 y.o.   MRN: [id number]  HPI: History of Present Illness Donna Shepard is a 58 year old female who presents for a routine annual exam.   Her blood pressure readings have been stable and normal at home, with occasional high readings of 180 mmHg in the past. No current issues with blood pressure are reported.  She is currently pursuing a master's degree in accounting at [employer] and has applied for a teaching position at [employer]. She is considering a study abroad program in [place].  No changes in vision or hearing, although she wears glasses for computer work. No shortness of breath, chest pain, dizziness, or anxiety-related symptoms. No vaginal bleeding due to a prior hysterectomy, and no skin changes, though she is due for a dermatology check-up.  Her last colonoscopy on July 25, 2022, revealed a small inflammatory polyp with no unusual cells. The next colonoscopy is due in 2034.  She does not currently take Xyzal  for allergies, as she did not experience significant allergy symptoms this past spring.   She reports feeling good and has managed recent life changes without stress while taking Lexapro .  She experiences motion sickness and uses scopolamine  for relief during travel. She is considering a trip next March and may need a refill.  She notes a weight increase, attributing it to changes in metabolism rather than diet or activity level. She remains active,  playing pickleball and walking her dog, and is mindful of her diet.  Pertinent items are noted in HPI.  Most Recent Depression Screen:     11/14/2023    9:44 AM 11/12/2022    8:08 AM 11/12/2022    8:07 AM 11/08/2021    7:13 PM 11/06/2020   10:41 AM  Depression screen PHQ 2/9  Decreased Interest 0 0 0 0 0  Down, Depressed, Hopeless 0 0 0 0 1  PHQ - 2 Score 0 0 0 0 1  Altered sleeping  1   0  Tired, decreased energy  0   1  Change in appetite  0   1  Feeling bad or failure about yourself   0   0  Trouble concentrating  0   0  Moving slowly or fidgety/restless  0   0  Suicidal thoughts  0   0  PHQ-9 Score  1   3  Difficult doing work/chores  Not difficult at all   Not difficult at all   Most Recent Anxiety Screen:     11/12/2022    8:08 AM 11/06/2020   10:41 AM  GAD 7 : Generalized Anxiety Score  Nervous, Anxious, on Edge 0 0  Control/stop worrying 0 0  Worry too much - different things 0 0  Trouble relaxing 0 0  Restless 0 0  Easily annoyed or irritable 0 0  Afraid - awful might happen 0 0  Total GAD 7 Score  0 0  Anxiety Difficulty Not difficult at all    Most Recent Fall Screen:    11/14/2023    9:43 AM 11/12/2022    8:07 AM 11/08/2021    9:42 AM 11/06/2020   10:40 AM  Fall Risk   Falls in the past year? 0 0 0 0  Number falls in past yr: 0 0 0 0  Injury with Fall? 0 0 0 0  Risk for fall due to : No Fall Risks No Fall Risks No Fall Risks No Fall Risks  Follow up Falls evaluation completed Falls evaluation completed Education provided;Falls evaluation completed  Falls evaluation completed      Data saved with a previous flowsheet row definition    Past medical history, surgical history, medications, allergies, family history and social history reviewed with patient today and changes made to appropriate areas of the chart.  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   cetirizine (ZYRTEC) 10 MG chewable tablet Chew 10 mg by mouth daily.   Vitamin D , Ergocalciferol , (DRISDOL ) 1.25 MG (50000 UNIT) CAPS capsule Take 1 capsule (50,000 Units total) by mouth every 7 (seven) days.   No current facility-administered medications on file prior to visit.   Surgical History:  Past Surgical History:  Procedure Laterality Date   COLONOSCOPY  2017   ROBOTIC ASSISTED TOTAL HYSTERECTOMY WITH BILATERAL SALPINGO OOPHERECTOMY Bilateral 03/31/2019   Procedure: XI ROBOTIC ASSISTED TOTAL HYSTERECTOMY WITH BILATERAL SALPINGO OOPHORECTOMY;  Surgeon: Viktoria Ellena Kamen SAUNDERS, MD;  Location: [facility];  Service: Gynecology;  Laterality: Bilateral;  BED HELD PATIENT TO BE DISCHARGED TODAY PER SCHEDULER   SENTINEL NODE BIOPSY N/A 03/31/2019   Procedure: SENTINEL NODE BIOPSY;  Surgeon: Viktoria Cumi Sanagustin SAUNDERS, MD;  Location: [facility];  Service: Gynecology;  Laterality: N/A;   Allergies:  Allergies  Allergen Reactions   Penicillins Hives and Rash    Did it involve swelling of the face/tongue/throat, SOB, or low BP? No Did it involve sudden or severe rash/hives, skin peeling, or any reaction on the inside of your mouth or nose? Yes Did you need to seek medical attention at a hospital or doctor's office? Yes When did it last happen?      5 years If all above answers are "NO", may proceed with cephalosporin use.    Family History:  Family History  Problem Relation Age of Onset   Hypertension Mother    Depression Mother     Hypertension Father    Depression Father    Hypertension Maternal Grandmother    Depression Brother    Breast cancer Neg Hx    Ovarian cancer Neg Hx    Endometrial cancer Neg Hx    BRCA 1/2 Neg Hx        Objective:    BP 122/80   Pulse 65   Ht 5' 5 (1.651 m)   Wt 263 lb (119.3 kg)   LMP  02/20/2018 (Approximate)   BMI 43.77 kg/m   Wt Readings from Last 3 Encounters:  11/14/23 263 lb (119.3 kg)  11/12/22 253 lb 12.8 oz (115.1 kg)  05/08/22 235 lb (106.6 kg)    Physical Exam Vitals and nursing note reviewed.  Constitutional:      General: She is not in acute distress.    Appearance: Normal appearance.  HENT:     Head: Normocephalic and atraumatic.     Right Ear: Hearing, tympanic membrane, ear canal and external ear normal.     Left Ear: Hearing, tympanic membrane, ear canal and external ear normal.     Nose: Nose normal.     Right Sinus: No maxillary sinus tenderness or frontal sinus tenderness.     Left Sinus: No maxillary sinus tenderness or frontal sinus tenderness.     Mouth/Throat:     Lips: Pink.     Mouth: Mucous membranes are moist.     Pharynx: Oropharynx is clear.  Eyes:     General: Lids are normal. Vision grossly intact.     Extraocular Movements: Extraocular movements intact.     Conjunctiva/sclera: Conjunctivae normal.     Pupils: Pupils are equal, round, and reactive to light.     Funduscopic exam:    Right eye: Red reflex present.        Left eye: Red reflex present.    Visual Fields: Right eye visual fields normal and left eye visual fields normal.  Neck:     Thyroid : No thyromegaly.     Vascular: No carotid bruit.  Cardiovascular:     Rate and Rhythm: Normal rate and regular rhythm.     Chest Wall: PMI is not displaced.     Pulses: Normal pulses.          Dorsalis pedis pulses are 2+ on the right side and 2+ on the left side.       Posterior tibial pulses are 2+ on the right side and 2+ on the left side.     Heart sounds: Normal heart  sounds. No murmur heard. Pulmonary:     Effort: Pulmonary effort is normal. No respiratory distress.     Breath sounds: Normal breath sounds.  Abdominal:     General: Abdomen is flat. Bowel sounds are normal. There is no distension.     Palpations: Abdomen is soft. There is no hepatomegaly, splenomegaly or mass.     Tenderness: There is no abdominal tenderness. There is no right CVA tenderness, left CVA tenderness, guarding or rebound.  Musculoskeletal:        General: Normal range of motion.     Cervical back: Full passive range of motion without pain, normal range of motion and neck supple. No tenderness.     Right lower leg: No edema.     Left lower leg: No edema.  Feet:     Left foot:     Toenail Condition: Left toenails are normal.  Lymphadenopathy:     Cervical: No cervical adenopathy.     Upper Body:     Right upper body: No supraclavicular adenopathy.     Left upper body: No supraclavicular adenopathy.  Skin:    General: Skin is warm and dry.     Capillary Refill: Capillary refill takes less than 2 seconds.     Nails: There is no clubbing.  Neurological:     General: No focal deficit present.     Mental Status: She is alert and oriented to person,  place, and time.     GCS: GCS eye subscore is 4. GCS verbal subscore is 5. GCS motor subscore is 6.     Sensory: Sensation is intact.     Motor: Motor function is intact.     Coordination: Coordination is intact.     Gait: Gait is intact.     Deep Tendon Reflexes: Reflexes are normal and symmetric.  Psychiatric:        Attention and Perception: Attention normal.        Mood and Affect: Mood normal.        Speech: Speech normal.        Behavior: Behavior normal. Behavior is cooperative.        Thought Content: Thought content normal.        Cognition and Memory: Cognition and memory normal.        Judgment: Judgment normal.      Results for orders placed or performed in visit on 11/12/22  CBC with Differential/Platelet    Collection Time: 11/12/22  9:01 AM  Result Value Ref Range   WBC 6.4 3.4 - 10.8 x10E3/uL   RBC 5.17 3.77 - 5.28 x10E6/uL   Hemoglobin 13.5 11.1 - 15.9 g/dL   Hematocrit 55.6 65.9 - 46.6 %   MCV 86 79 - 97 fL   MCH 26.1 (L) 26.6 - 33.0 pg   MCHC 30.5 (L) 31.5 - 35.7 g/dL   RDW 86.2 88.2 - 84.5 %   Platelets 355 150 - 450 x10E3/uL   Neutrophils 59 Not Estab. %   Lymphs 24 Not Estab. %   Monocytes 9 Not Estab. %   Eos 7 Not Estab. %   Basos 1 Not Estab. %   Neutrophils Absolute 3.8 1.4 - 7.0 x10E3/uL   Lymphocytes Absolute 1.6 0.7 - 3.1 x10E3/uL   Monocytes Absolute 0.6 0.1 - 0.9 x10E3/uL   EOS (ABSOLUTE) 0.4 0.0 - 0.4 x10E3/uL   Basophils Absolute 0.1 0.0 - 0.2 x10E3/uL   Immature Granulocytes 0 Not Estab. %   Immature Grans (Abs) 0.0 0.0 - 0.1 x10E3/uL  CMP14+EGFR   Collection Time: 11/12/22  9:01 AM  Result Value Ref Range   Glucose 92 70 - 99 mg/dL   BUN 10 6 - 24 mg/dL   Creatinine, Ser 8.98 (H) 0.57 - 1.00 mg/dL   eGFR 65 >40 fO/fpw/8.26   BUN/Creatinine Ratio 10 9 - 23   Sodium 141 134 - 144 mmol/L   Potassium 4.9 3.5 - 5.2 mmol/L   Chloride 104 96 - 106 mmol/L   CO2 24 20 - 29 mmol/L   Calcium 9.5 8.7 - 10.2 mg/dL   Total Protein 7.0 6.0 - 8.5 g/dL   Albumin 4.2 3.8 - 4.9 g/dL   Globulin, Total 2.8 1.5 - 4.5 g/dL   Bilirubin Total 0.3 0.0 - 1.2 mg/dL   Alkaline Phosphatase 120 44 - 121 IU/L   AST 21 0 - 40 IU/L   ALT 23 0 - 32 IU/L  Hemoglobin A1c   Collection Time: 11/12/22  9:01 AM  Result Value Ref Range   Hgb A1c MFr Bld 5.6 4.8 - 5.6 %   Est. average glucose Bld gHb Est-mCnc 114 mg/dL  Lipid panel   Collection Time: 11/12/22  9:01 AM  Result Value Ref Range   Cholesterol, Total 177 100 - 199 mg/dL   Triglycerides 73 0 - 149 mg/dL   HDL 57 >60 mg/dL   VLDL Cholesterol Cal 14 5 - 40 mg/dL  LDL Chol Calc (NIH) 106 (H) 0 - 99 mg/dL   Chol/HDL Ratio 3.1 0.0 - 4.4 ratio  VITAMIN D  25 Hydroxy (Vit-D Deficiency, Fractures)   Collection Time: 11/12/22   9:01 AM  Result Value Ref Range   Vit D, 25-Hydroxy 28.8 (L) 30.0 - 100.0 ng/mL       Assessment & Plan:   Problem List Items Addressed This Visit     Vitamin D  deficiency   Chronic. Repeat labs today for monitoring of therapy.       Mild depression   Well-managed on Lexapro . Reports feeling good and managing stress well despite recent life changes.       Relevant Medications   escitalopram  (LEXAPRO ) 10 MG tablet   Encounter for annual physical exam - Primary   CPE completed today. Review of HM activities and recommendations discussed and provided on AVS. Anticipatory guidance, diet, and exercise recommendations provided. Medications, allergies, and hx reviewed and updated as necessary. Orders placed as listed below.  Plan: - Labs ordered. Will make changes as necessary based on results.  - I will review these results and send recommendations via MyChart or a telephone call.  - She wishes to wait for pneumonia vaccine.  - F/U with CPE in 1 year or sooner for acute/chronic health needs as directed.        Relevant Orders   CBC with Differential/Platelet   CMP14+EGFR   Hemoglobin A1c   Lipid panel   Eosinophilic esophagitis   Chronic. Well controlled. Refill on omeprazole  today. No concerning findings on exam.       Relevant Medications   omeprazole  (PRILOSEC) 40 MG capsule   Other Relevant Orders   CBC with Differential/Platelet   Moderate mixed hyperlipidemia not requiring statin therapy   Chronic. Recommend diet and exercise management. In the setting of BMI >40, there are concerns for increased risks of cardiovascular disease. We discussed ways to help control weight and foods to help manage.       Relevant Orders   CBC with Differential/Platelet   CMP14+EGFR   Hemoglobin A1c   Lipid panel   H/O motion sickness   Experiences motion sickness, especially during travel. Previously used scopolamine  for relief. 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 In the setting of increased lipids, there is an increased risk of cardiovascular concern with weight. No significant changes in eating habits. Engages in pickleball for physical activity. We discussed ways to remain active and monitor eating to create healthy habits rather than dieting. - Encourage 20 minutes of daily physical activity to increase heart rate - Advise on portion control and mindful eating habits - Discuss the importance of stopping eating when satisfied rather than full      Relevant Orders   CBC with Differential/Platelet   CMP14+EGFR   Hemoglobin A1c   Lipid panel   Other Visit Diagnoses       Screening for endocrine, nutritional, metabolic and immunity disorder       Relevant Orders   CBC with Differential/Platelet   CMP14+EGFR   Hemoglobin A1c   Lipid panel     Need for influenza vaccination       Relevant Orders   Flu vaccine  trivalent PF, 6mos and older(Flulaval ,Afluria,Fluarix,Fluzone) (Completed)        Follow up plan: Return in about 1 year (around 11/13/2024) for CPE.  NEXT PREVENTATIVE PHYSICAL DUE IN 1 YEAR.  PATIENT COUNSELING PROVIDED FOR ALL ADULT PATIENTS: A well balanced diet low in saturated fats, cholesterol, and moderation in carbohydrates.  This can be as simple as monitoring portion sizes and cutting back on sugary beverages such as soda and juice to start with.    Daily water consumption of at least 64 ounces.  Physical activity at least 180 minutes per week.  If just starting out, start 10 minutes a day and work your way up.   This can be as simple as taking the stairs instead of the elevator and walking 2-3 laps around the office  purposefully every day.    STD protection, partner selection, and regular testing if high risk.  Limited consumption of alcoholic beverages if alcohol is consumed. For men, I recommend no more than 14 alcoholic beverages per week, spread out throughout the week (max 2 per day). Avoid binge drinking or consuming large quantities of alcohol in one setting.  Please let me know if you feel you may need help with reduction or quitting alcohol consumption.   Avoidance of nicotine, if used. Please let me know if you feel you may need help with reduction or quitting nicotine use.   Daily mental health attention. This can be in the form of 5 minute daily meditation, prayer, journaling, yoga, reflection, etc.  Purposeful attention to your emotions and mental state can significantly improve your overall wellbeing  and  Health.  Please know that I am here to help you with all of your health care goals and am happy to work with you to find a solution that works best for you.  The greatest advice I have received with any changes in life are to take it one step at a time, that even means if all you can focus on is the next 60 seconds, then do that and celebrate your victories.  With any changes in life, you will have set backs, and that is OK. The important thing to remember is, if you have a set back, it is not a failure, it is an opportunity to try again! Screening Testing Mammogram Every 1 -2 years based on history and risk factors Starting at age 86 Pap Smear Ages 21-39 every 3 years Ages 76-65 every 5 years with HPV testing More frequent testing may be required based on results and history Colon Cancer Screening Every 1-10 years based on test performed, risk factors, and history Starting at age 61 Bone Density Screening Every 2-10 years based on history Starting at age 44 for women Recommendations for men differ based on medication usage, history, and risk factors AAA Screening One time ultrasound Men 15-75 years  old who have every smoked Lung Cancer Screening Low Dose Lung CT every 12 months Age 35-80 years with a 30 pack-year smoking history who still smoke or who have quit within the last 15 years   Screening Labs Routine  Labs: Complete Blood Count (CBC), Complete Metabolic Panel (CMP), Cholesterol (Lipid Panel) Every 6-12 months based on history and medications May be recommended more frequently based on current conditions or previous results Hemoglobin A1c Lab Every 3-12 months based on history and previous results Starting at age 65 or earlier with diagnosis of diabetes, high cholesterol, BMI >26, and/or risk factors Frequent monitoring for patients with diabetes to ensure  blood sugar control Thyroid  Panel (TSH) Every 6 months based on history, symptoms, and risk factors May be repeated more often if on medication HIV One time testing for all patients 54 and older May be repeated more frequently for patients with increased risk factors or exposure Hepatitis C One time testing for all patients 62 and older May be repeated more frequently for patients with increased risk factors or exposure Gonorrhea, Chlamydia Every 12 months for all sexually active persons 13-24 years Additional monitoring may be recommended for those who are considered high risk or who have symptoms Every 12 months for any woman on birth control, regardless of sexual activity PSA Men 28-28 years old with risk factors Additional screening may be recommended from age 55-69 based on risk factors, symptoms, and history  Vaccine Recommendations Tetanus Booster All adults every 10 years Flu Vaccine All patients 6 months and older every year COVID Vaccine All patients 12 years and older Initial dosing with booster May recommend additional booster based on age and health history HPV Vaccine 2 doses all patients age 70-26 Dosing may be considered for patients over 26 Shingles Vaccine (Shingrix ) 2 doses all adults 55  years and older Pneumonia (Pneumovax 23) All adults 65 years and older May recommend earlier dosing based on health history One year apart from Prevnar 13 Pneumonia (Prevnar 64) All adults 65 years and older Dosed 1 year after Pneumovax 23 Pneumonia (Prevnar 20) One time alternative to the two dosing of 13 and 23 For all adults with initial dose of 23, 20 is recommended 1 year later For all adults with initial dose of 13, 23 is still recommended as second option 1 year later

## 2023-11-14 ENCOUNTER — Ambulatory Visit: Payer: BC Managed Care – PPO | Admitting: Nurse Practitioner

## 2023-11-14 VITALS — BP 122/80 | HR 65 | Ht 65.0 in | Wt 263.0 lb

## 2023-11-14 DIAGNOSIS — Z130 Encounter for screening for diseases of the blood and blood-forming organs and certain disorders involving the immune mechanism: Secondary | ICD-10-CM

## 2023-11-14 DIAGNOSIS — E661 Drug-induced obesity: Secondary | ICD-10-CM

## 2023-11-14 DIAGNOSIS — E66813 Obesity, class 3: Secondary | ICD-10-CM | POA: Diagnosis not present

## 2023-11-14 DIAGNOSIS — Z0000 Encounter for general adult medical examination without abnormal findings: Secondary | ICD-10-CM | POA: Diagnosis not present

## 2023-11-14 DIAGNOSIS — K200 Eosinophilic esophagitis: Secondary | ICD-10-CM | POA: Diagnosis not present

## 2023-11-14 DIAGNOSIS — Z23 Encounter for immunization: Secondary | ICD-10-CM | POA: Diagnosis not present

## 2023-11-14 DIAGNOSIS — Z87898 Personal history of other specified conditions: Secondary | ICD-10-CM | POA: Diagnosis not present

## 2023-11-14 DIAGNOSIS — F32A Depression, unspecified: Secondary | ICD-10-CM | POA: Diagnosis not present

## 2023-11-14 DIAGNOSIS — Z1321 Encounter for screening for nutritional disorder: Secondary | ICD-10-CM

## 2023-11-14 DIAGNOSIS — E559 Vitamin D deficiency, unspecified: Secondary | ICD-10-CM

## 2023-11-14 DIAGNOSIS — E782 Mixed hyperlipidemia: Secondary | ICD-10-CM | POA: Diagnosis not present

## 2023-11-14 DIAGNOSIS — Z6841 Body Mass Index (BMI) 40.0 and over, adult: Secondary | ICD-10-CM | POA: Insufficient documentation

## 2023-11-14 DIAGNOSIS — Z1329 Encounter for screening for other suspected endocrine disorder: Secondary | ICD-10-CM

## 2023-11-14 LAB — LIPID PANEL

## 2023-11-14 MED ORDER — OMEPRAZOLE 40 MG PO CPDR
40.0000 mg | DELAYED_RELEASE_CAPSULE | Freq: Every day | ORAL | 3 refills | Status: AC
Start: 2023-11-14 — End: ?

## 2023-11-14 MED ORDER — SCOPOLAMINE 1 MG/3DAYS TD PT72: 1.0000 | MEDICATED_PATCH | TRANSDERMAL | 1 refills | Status: AC

## 2023-11-14 MED ORDER — ESCITALOPRAM OXALATE 10 MG PO TABS
10.0000 mg | ORAL_TABLET | Freq: Every day | ORAL | 3 refills | Status: AC
Start: 2023-11-14 — End: ?

## 2023-11-14 NOTE — Assessment & Plan Note (Signed)
 Well-managed on Lexapro . Reports feeling good and managing stress well despite recent life changes.

## 2023-11-14 NOTE — Assessment & Plan Note (Signed)
Chronic. Well controlled. Refill on omeprazole today. No concerning findings on exam.

## 2023-11-14 NOTE — Assessment & Plan Note (Signed)
 CPE completed today. Review of HM activities and recommendations discussed and provided on AVS. Anticipatory guidance, diet, and exercise recommendations provided. Medications, allergies, and hx reviewed and updated as necessary. Orders placed as listed below.  Plan: - Labs ordered. Will make changes as necessary based on results.  - I will review these results and send recommendations via MyChart or a telephone call.  - She wishes to wait for pneumonia vaccine.  - F/U with CPE in 1 year or sooner for acute/chronic health needs as directed.

## 2023-11-14 NOTE — Assessment & Plan Note (Signed)
 Weight gain likely due to age-related metabolic changes and reduced physical activity. In the setting of increased lipids, there is an increased risk of cardiovascular concern with weight. No significant changes in eating habits. Engages in pickleball for physical activity. We discussed ways to remain active and monitor eating to create healthy habits rather than dieting. - Encourage 20 minutes of daily physical activity to increase heart rate - Advise on portion control and mindful eating habits - Discuss the importance of stopping eating when satisfied rather than full

## 2023-11-14 NOTE — Patient Instructions (Addendum)
 I am so excited for you and your new journey! This is amazing!!  We can plan on your pneumonia vaccine when you are ready for this.    For all adult patients, I recommend A well balanced diet low in saturated fats, cholesterol, and moderation in carbohydrates.   This can be as simple as monitoring portion sizes and cutting back on sugary beverages such as soda and juice to start with.    Daily water consumption of at least 64 ounces.  Physical activity at least 180 minutes per week, if just starting out.   This can be as simple as taking the stairs instead of the elevator and walking 2-3 laps around the office  purposefully every day.   STD protection, partner selection, and regular testing if high risk.  Limited consumption of alcoholic beverages if alcohol is consumed.  For women, I recommend no more than 7 alcoholic beverages per week, spread out throughout the week.  Avoid binge drinking or consuming large quantities of alcohol in one setting.   Please let me know if you feel you may need help with reduction or quitting alcohol consumption.   Avoidance of nicotine, if used.  Please let me know if you feel you may need help with reduction or quitting nicotine use.   Daily mental health attention.  This can be in the form of 5 minute daily meditation, prayer, journaling, yoga, reflection, etc.   Purposeful attention to your emotions and mental state can significantly improve your overall wellbeing  and  Health.  Please know that I am here to help you with all of your health care goals and am happy to work with you to find a solution that works best for you.  The greatest advice I have received with any changes in life are to take it one step at a time, that even means if all you can focus on is the next 60 seconds, then do that and celebrate your victories.  With any changes in life, you will have set backs, and that is OK. The important thing to remember is, if you have a set back,  it is not a failure, it is an opportunity to try again!  Health Maintenance Recommendations Screening Testing Mammogram Every 1 -2 years based on history and risk factors Starting at age 85 Pap Smear Ages 21-39 every 3 years Ages 30-65 every 5 years with HPV testing More frequent testing may be required based on results and history Colon Cancer Screening Every 1-10 years based on test performed, risk factors, and history Starting at age 24 Bone Density Screening Every 2-10 years based on history Starting at age 91 for women Recommendations for men differ based on medication usage, history, and risk factors AAA Screening One time ultrasound Men 79-72 years old who have every smoked Lung Cancer Screening Low Dose Lung CT every 12 months Age 53-80 years with a 30 pack-year smoking history who still smoke or who have quit within the last 15 years  Screening Labs Routine  Labs: Complete Blood Count (CBC), Complete Metabolic Panel (CMP), Cholesterol (Lipid Panel) Every 6-12 months based on history and medications May be recommended more frequently based on current conditions or previous results Hemoglobin A1c Lab Every 3-12 months based on history and previous results Starting at age 44 or earlier with diagnosis of diabetes, high cholesterol, BMI >26, and/or risk factors Frequent monitoring for patients with diabetes to ensure blood sugar control Thyroid  Panel (TSH w/ T3 & T4) Every  6 months based on history, symptoms, and risk factors May be repeated more often if on medication HIV One time testing for all patients 32 and older May be repeated more frequently for patients with increased risk factors or exposure Hepatitis C One time testing for all patients 65 and older May be repeated more frequently for patients with increased risk factors or exposure Gonorrhea, Chlamydia Every 12 months for all sexually active persons 13-24 years Additional monitoring may be recommended for  those who are considered high risk or who have symptoms PSA Men 59-92 years old with risk factors Additional screening may be recommended from age 2-69 based on risk factors, symptoms, and history  Vaccine Recommendations Tetanus Booster All adults every 10 years Flu Vaccine All patients 6 months and older every year COVID Vaccine All patients 12 years and older Initial dosing with booster May recommend additional booster based on age and health history HPV Vaccine 2 doses all patients age 4-26 Dosing may be considered for patients over 26 Shingles Vaccine (Shingrix ) 2 doses all adults 55 years and older Pneumonia (Pneumovax 23) All adults 65 years and older May recommend earlier dosing based on health history Pneumonia (Prevnar 37) All adults 65 years and older Dosed 1 year after Pneumovax 23  Additional Screening, Testing, and Vaccinations may be recommended on an individualized basis based on family history, health history, risk factors, and/or exposure.

## 2023-11-14 NOTE — Assessment & Plan Note (Signed)
 Experiences motion sickness, especially during travel. Previously used scopolamine  for relief. - Send prescription for scopolamine  for future travel needs

## 2023-11-14 NOTE — Assessment & Plan Note (Signed)
 Chronic. Recommend diet and exercise management. In the setting of BMI >40, there are concerns for increased risks of cardiovascular disease. We discussed ways to help control weight and foods to help manage.

## 2023-11-14 NOTE — Assessment & Plan Note (Signed)
Chronic. Repeat labs today for monitoring of therapy.

## 2023-11-15 LAB — LIPID PANEL
Chol/HDL Ratio: 3.5 ratio (ref 0.0–4.4)
Cholesterol, Total: 188 mg/dL (ref 100–199)
HDL: 54 mg/dL (ref >39)
LDL Chol Calc (NIH): 114 mg/dL — ABNORMAL HIGH (ref 0–99)
Triglycerides: 111 mg/dL (ref 0–149)
VLDL Cholesterol Cal: 20 mg/dL (ref 5–40)

## 2023-11-15 LAB — CMP14+EGFR
ALT: 18 IU/L (ref 0–32)
AST: 20 IU/L (ref 0–40)
Albumin: 4.3 g/dL (ref 3.8–4.9)
Alkaline Phosphatase: 128 IU/L (ref 49–135)
BUN/Creatinine Ratio: 13 (ref 9–23)
BUN: 12 mg/dL (ref 6–24)
Bilirubin Total: 0.4 mg/dL (ref 0.0–1.2)
CO2: 21 mmol/L (ref 20–29)
Calcium: 9.4 mg/dL (ref 8.7–10.2)
Chloride: 103 mmol/L (ref 96–106)
Creatinine, Ser: 0.9 mg/dL (ref 0.57–1.00)
Globulin, Total: 2.8 g/dL (ref 1.5–4.5)
Glucose: 93 mg/dL (ref 70–99)
Potassium: 4.5 mmol/L (ref 3.5–5.2)
Sodium: 142 mmol/L (ref 134–144)
Total Protein: 7.1 g/dL (ref 6.0–8.5)
eGFR: 74 mL/min/1.73 (ref >59)

## 2023-11-15 LAB — CBC WITH DIFFERENTIAL/PLATELET
Basophils Absolute: 0.1 x10E3/uL (ref 0.0–0.2)
Basos: 1 %
EOS (ABSOLUTE): 0.2 x10E3/uL (ref 0.0–0.4)
Eos: 3 %
Hematocrit: 38.9 % (ref 34.0–46.6)
Hemoglobin: 12.1 g/dL (ref 11.1–15.9)
Immature Grans (Abs): 0 x10E3/uL (ref 0.0–0.1)
Immature Granulocytes: 0 %
Lymphocytes Absolute: 1.6 x10E3/uL (ref 0.7–3.1)
Lymphs: 23 %
MCH: 26.1 pg — ABNORMAL LOW (ref 26.6–33.0)
MCHC: 31.1 g/dL — ABNORMAL LOW (ref 31.5–35.7)
MCV: 84 fL (ref 79–97)
Monocytes Absolute: 0.5 x10E3/uL (ref 0.1–0.9)
Monocytes: 7 %
Neutrophils Absolute: 4.7 x10E3/uL (ref 1.4–7.0)
Neutrophils: 66 %
Platelets: 353 x10E3/uL (ref 150–450)
RBC: 4.64 x10E6/uL (ref 3.77–5.28)
RDW: 14.1 % (ref 11.7–15.4)
WBC: 7.1 x10E3/uL (ref 3.4–10.8)

## 2023-11-15 LAB — HEMOGLOBIN A1C
Est. average glucose Bld gHb Est-mCnc: 108 mg/dL
Hgb A1c MFr Bld: 5.4 % (ref 4.8–5.6)

## 2023-11-19 ENCOUNTER — Ambulatory Visit: Payer: Self-pay | Admitting: Nurse Practitioner

## 2024-12-03 ENCOUNTER — Encounter: Payer: Self-pay | Admitting: Nurse Practitioner
